# Patient Record
Sex: Male | Born: 1975 | Race: White | Hispanic: No | Marital: Married | Smoking: Never smoker
Health system: Southern US, Community
[De-identification: ages and names within clinical notes are randomized; demographics above are authoritative.]

## PROBLEM LIST (undated history)

## (undated) DIAGNOSIS — G4700 Insomnia, unspecified: Secondary | ICD-10-CM

## (undated) DIAGNOSIS — E66811 Obesity, class 1: Secondary | ICD-10-CM

## (undated) DIAGNOSIS — K589 Irritable bowel syndrome without diarrhea: Secondary | ICD-10-CM

## (undated) DIAGNOSIS — E669 Obesity, unspecified: Secondary | ICD-10-CM

## (undated) DIAGNOSIS — K760 Fatty (change of) liver, not elsewhere classified: Secondary | ICD-10-CM

## (undated) DIAGNOSIS — T7840XA Allergy, unspecified, initial encounter: Secondary | ICD-10-CM

## (undated) DIAGNOSIS — J302 Other seasonal allergic rhinitis: Secondary | ICD-10-CM

## (undated) DIAGNOSIS — F419 Anxiety disorder, unspecified: Secondary | ICD-10-CM

## (undated) HISTORY — DX: Fatty (change of) liver, not elsewhere classified: K76.0

## (undated) HISTORY — DX: Irritable bowel syndrome, unspecified: K58.9

## (undated) HISTORY — DX: Allergy, unspecified, initial encounter: T78.40XA

## (undated) HISTORY — DX: Morbid (severe) obesity due to excess calories: E66.01

## (undated) HISTORY — DX: Other seasonal allergic rhinitis: J30.2

## (undated) HISTORY — DX: Anxiety disorder, unspecified: F41.9

## (undated) HISTORY — DX: Insomnia, unspecified: G47.00

## (undated) HISTORY — DX: Obesity, class 1: E66.811

## (undated) HISTORY — DX: Obesity, unspecified: E66.9

---

## 2009-11-23 ENCOUNTER — Emergency Department (HOSPITAL_BASED_OUTPATIENT_CLINIC_OR_DEPARTMENT_OTHER): Admission: EM | Admit: 2009-11-23 | Discharge: 2009-11-23 | Payer: Self-pay | Admitting: Emergency Medicine

## 2011-01-25 ENCOUNTER — Ambulatory Visit: Payer: BC Managed Care – PPO | Attending: Orthopedic Surgery | Admitting: Physical Therapy

## 2011-01-25 DIAGNOSIS — R5381 Other malaise: Secondary | ICD-10-CM | POA: Insufficient documentation

## 2011-01-25 DIAGNOSIS — IMO0001 Reserved for inherently not codable concepts without codable children: Secondary | ICD-10-CM | POA: Insufficient documentation

## 2011-01-25 DIAGNOSIS — M25519 Pain in unspecified shoulder: Secondary | ICD-10-CM | POA: Insufficient documentation

## 2011-01-25 DIAGNOSIS — M25619 Stiffness of unspecified shoulder, not elsewhere classified: Secondary | ICD-10-CM | POA: Insufficient documentation

## 2011-02-05 ENCOUNTER — Ambulatory Visit: Payer: BC Managed Care – PPO | Attending: Orthopedic Surgery | Admitting: Physical Therapy

## 2011-02-05 DIAGNOSIS — M25519 Pain in unspecified shoulder: Secondary | ICD-10-CM | POA: Insufficient documentation

## 2011-02-05 DIAGNOSIS — M25619 Stiffness of unspecified shoulder, not elsewhere classified: Secondary | ICD-10-CM | POA: Insufficient documentation

## 2011-02-05 DIAGNOSIS — R5381 Other malaise: Secondary | ICD-10-CM | POA: Insufficient documentation

## 2011-02-05 DIAGNOSIS — IMO0001 Reserved for inherently not codable concepts without codable children: Secondary | ICD-10-CM | POA: Insufficient documentation

## 2011-02-08 ENCOUNTER — Encounter: Payer: BC Managed Care – PPO | Admitting: *Deleted

## 2013-04-15 ENCOUNTER — Ambulatory Visit: Payer: Self-pay | Admitting: Family Medicine

## 2014-06-29 ENCOUNTER — Telehealth: Payer: Self-pay | Admitting: Family Medicine

## 2014-06-29 NOTE — Telephone Encounter (Signed)
Patient aware that we have no available appointments in the next few weeks for a new patient.

## 2014-07-15 ENCOUNTER — Ambulatory Visit (INDEPENDENT_AMBULATORY_CARE_PROVIDER_SITE_OTHER): Payer: BC Managed Care – PPO | Admitting: Family Medicine

## 2014-07-15 ENCOUNTER — Encounter: Payer: Self-pay | Admitting: Family Medicine

## 2014-07-15 VITALS — BP 119/81 | HR 77 | Temp 98.2°F | Resp 18 | Ht 72.0 in | Wt 235.0 lb

## 2014-07-15 DIAGNOSIS — G4700 Insomnia, unspecified: Secondary | ICD-10-CM

## 2014-07-15 DIAGNOSIS — F411 Generalized anxiety disorder: Secondary | ICD-10-CM

## 2014-07-15 MED ORDER — ALPRAZOLAM 1 MG PO TABS: ORAL_TABLET | ORAL | Status: DC

## 2014-07-15 NOTE — Progress Notes (Signed)
Pre visit review using our clinic review tool, if applicable. No additional management support is needed unless otherwise documented below in the visit note. 

## 2014-07-15 NOTE — Progress Notes (Signed)
Office Note 07/15/2014  CC:  Chief Complaint  Patient presents with  . Establish Care    [facility].    HPI:  George Lam is a 38 y.o. White male who is here to establish care. Patient's most recent primary MD: [facility]: "left b/c too many doctors lately". Old records were not reviewed prior to or during today's visit.  Primary prob is anxiety: mind won't stop, problems sleeping due to this, work related stress, travel related stress, irritable, keyed up, energy down.  Takes 1/2 alprazolam tab late in afternoon or 1/4 tab prior to flying. These worries/anxieties are a problem in evenings, not so much in daytime.  Recently his prior PCP (one of them at [facility]) tried him on lexapro and he took it for about 2 wks and it helped a bit with anxiety but didn't help sleep so he went back to taking some leftover alprazolam. He needs RF of this med though.   Past Medical History  Diagnosis Date  . Seasonal allergic rhinitis   . Anxiety     alpraz started about 2011    History reviewed. No pertinent past surgical history.  Family History  Problem Relation Age of Onset  . Arthritis Father   . Colon cancer Neg Hx   . Prostate cancer Neg Hx     History   Social History  . Marital Status: Married    Spouse Name: N/A    Number of Children: N/A  . Years of Education: N/A   Occupational History  . Not on file.   Social History Main Topics  . Smoking status: Never Smoker   . Smokeless tobacco: Never Used  . Alcohol Use: No  . Drug Use: No  . Sexual Activity: Not on file   Other Topics Concern  . Not on file   Social History Narrative   Married, 2 sons, lives in [place].   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   College: [employer], where he is originally from.   No T/A/Ds.    Outpatient Encounter Prescriptions as of 07/15/2014  Medication Sig  . ALPRAZolam (XANAX) 1 MG tablet 1-2 tab po qd prn  anxiety/insomnia  . [DISCONTINUED] alprazolam (XANAX) 2 MG tablet   1/2 tab po qhs  No Known Allergies  ROS Review of Systems  Constitutional: Negative for fever and fatigue.  HENT: Negative for congestion and sore throat.   Eyes: Negative for visual disturbance.  Respiratory: Negative for cough.   Cardiovascular: Negative for chest pain.  Gastrointestinal: Negative for nausea and abdominal pain.  Genitourinary: Negative for dysuria.  Musculoskeletal: Negative for back pain and joint swelling.  Skin: Negative for rash.  Neurological: Negative for weakness and headaches.  Hematological: Negative for adenopathy.  Psychiatric/Behavioral: Negative for dysphoric mood.    PE; Blood pressure 119/81, pulse 77, temperature 98.2 F (36.8 C), temperature source Temporal, resp. rate 18, height 6' (1.829 m), weight 235 lb (106.595 kg), SpO2 98 %. Gen: Alert, well appearing.  Patient is oriented to person, place, time, and situation. XIP:JASN: no injection, icteris, swelling, or exudate.  EOMI, PERRLA. Mouth: lips without lesion/swelling.  Oral mucosa pink and moist. Oropharynx without erythema, exudate, or swelling.  CV: RRR, no m/r/g.   LUNGS: CTA bilat, nonlabored resps, good aeration in all lung fields. EXT: no clubbing, cyanosis, or edema.  Neuro: CN 2-12 intact bilaterally.  No tremor.    No ataxia.  No pronator drift.  Pertinent labs:  none  ASSESSMENT AND  PLAN:   New pt  Insomnia, with hx of situational anxiety (associated with car or airplane travel): has had several years of success on 1mg  alprazolam dosing qhs and occasional 0.5mg  alpraz dosing prn travel. Recent lexapro trial was fine for mild generalized anxiety but we discussed this today and don't think this is necessary for him at this time.  He'll call or return if this situation changes. For now, continue alpraz 1mg  qhs and 0.5 mg prn travel, #45 per month, RF x 5. Obtain old PCP records.  F/u 6 mo

## 2014-08-10 ENCOUNTER — Ambulatory Visit (INDEPENDENT_AMBULATORY_CARE_PROVIDER_SITE_OTHER): Payer: BLUE CROSS/BLUE SHIELD | Admitting: Family Medicine

## 2014-08-10 ENCOUNTER — Encounter: Payer: Self-pay | Admitting: Family Medicine

## 2014-08-10 VITALS — BP 154/89 | HR 118 | Temp 101.4°F | Resp 22 | Ht 72.0 in | Wt 236.0 lb

## 2014-08-10 DIAGNOSIS — R509 Fever, unspecified: Secondary | ICD-10-CM

## 2014-08-10 DIAGNOSIS — J028 Acute pharyngitis due to other specified organisms: Secondary | ICD-10-CM

## 2014-08-10 DIAGNOSIS — J029 Acute pharyngitis, unspecified: Secondary | ICD-10-CM

## 2014-08-10 DIAGNOSIS — I889 Nonspecific lymphadenitis, unspecified: Secondary | ICD-10-CM

## 2014-08-10 DIAGNOSIS — E860 Dehydration: Secondary | ICD-10-CM

## 2014-08-10 MED ORDER — OXYCODONE-ACETAMINOPHEN 5-325 MG PO TABS: 1.0000 | ORAL_TABLET | Freq: Four times a day (QID) | ORAL | Status: DC | PRN

## 2014-08-10 MED ORDER — CEFDINIR 300 MG PO CAPS: 300.0000 mg | ORAL_CAPSULE | Freq: Two times a day (BID) | ORAL | Status: DC

## 2014-08-10 NOTE — Progress Notes (Signed)
OFFICE NOTE  08/10/2014  CC:  Chief Complaint  Patient presents with  . Sore Throat    x 4 days  . Ear Pain  . Headache  . Generalized Body Aches   HPI: Patient is a 39 y.o. male who is here for sore throat.   Onset 4 days ago, ST, fever, achy, lots of ear pressure today, HA.  No significant cough.  Some slight nasal congestion.  No n/v/d.  No appetite, poor sleep.  No abd pain. No recent sick contacts.  Motrin 600mg  q6h.  Nyquil, advil cold/sinus etc.  Gradually sx's worsening.  Pertinent PMH:  Past medical, surgical, social, and family history reviewed and no changes are noted since last office visit. No signif med issues.  MEDS:  Outpatient Prescriptions Prior to Visit  Medication Sig Dispense Refill  . ALPRAZolam (XANAX) 1 MG tablet 1-2 tab po qd prn anxiety/insomnia 45 tablet 5   No facility-administered medications prior to visit.    PE: Blood pressure 154/89, pulse 118, temperature 101.4 F (38.6 C), temperature source Temporal, resp. rate 22, height 6' (1.829 m), weight 236 lb (107.049 kg), SpO2 97 %. Gen: alert, sick appearing, slumped posture on exam table.  Oriented x 4, talks softly b/c hurts to talk.  No hot-potato voice. HEENT: eyes without injection, drainage, or swelling.  Ears: EACs clear, TMs with normal light reflex and landmarks.  Nose: Clear rhinorrhea, with some dried, crusty exudate adherent to mildly injected mucosa.  No purulent d/c.  No paranasal sinus TTP.  No facial swelling.  Throat and mouth without focal lesion.  Mild pharyngeal and soft palate erythema but no exudate or significant swelling.   Neck: supple, no distinct LAD but he has significant tenderness along anter cervical LN chain bilat.  NO signif tracheal tenderness.   LUNGS: CTA bilat, nonlabored resps.  No stridor. CV: RRR, no m/r/g. EXT: no c/c/e SKIN: no rash  RAPID strep test today was NEG.  IMPRESSION AND PLAN:  Febrile resp illness/pharyngitis, rapid strep neg: this could be  ILI with complication of cervical LAD. Could be strep pharyngitis with false neg rapid strep. Could be other viral pharyngitis. With length of illness/fever, and significant cervical LN tenderness I will start empiric omnicef 300 mg bid and send group A strep throat culture. Will rx percocet 5/325, 1-2 q6h prn, #30 for symptom management, encouraged oral rehydration.  An After Visit Summary was printed and given to the patient.   FOLLOW UP: call or return in 2 d if fevers not resolved or if ST not improved any.

## 2014-08-10 NOTE — Progress Notes (Signed)
Pre visit review using our clinic review tool, if applicable. No additional management support is needed unless otherwise documented below in the visit note. 

## 2014-08-12 LAB — CULTURE, GROUP A STREP: ORGANISM ID, BACTERIA: NORMAL

## 2015-01-12 ENCOUNTER — Ambulatory Visit (INDEPENDENT_AMBULATORY_CARE_PROVIDER_SITE_OTHER): Payer: BLUE CROSS/BLUE SHIELD | Admitting: Family Medicine

## 2015-01-12 ENCOUNTER — Encounter: Payer: Self-pay | Admitting: Family Medicine

## 2015-01-12 VITALS — BP 116/80 | HR 60 | Temp 97.9°F | Resp 16 | Wt 242.0 lb

## 2015-01-12 DIAGNOSIS — F418 Other specified anxiety disorders: Secondary | ICD-10-CM

## 2015-01-12 DIAGNOSIS — G4700 Insomnia, unspecified: Secondary | ICD-10-CM | POA: Diagnosis not present

## 2015-01-12 DIAGNOSIS — F411 Generalized anxiety disorder: Secondary | ICD-10-CM

## 2015-01-12 MED ORDER — ALPRAZOLAM 1 MG PO TABS: ORAL_TABLET | ORAL | Status: DC

## 2015-01-12 NOTE — Progress Notes (Signed)
Pre visit review using our clinic review tool, if applicable. No additional management support is needed unless otherwise documented below in the visit note. 

## 2015-01-12 NOTE — Progress Notes (Signed)
OFFICE NOTE  01/12/2015  CC:  Chief Complaint  Patient presents with  . Follow-up    6 month f/u. Pt is fasting.    HPI: Patient is a 39 y.o. Caucasian male who is here for 6 mo f/u insomnia and situational anxiety>generalized anxiety. He is fasting today.  Review of old PCP records shows last health panel labs were 03/2014 and all normal. Takes 1mg  xanax around 8 pm and this helps calm him and allows for good sleep.  Pertinent PMH:  Past medical, surgical, social, and family history reviewed and no changes are noted since last office visit.  MEDS: Not taking cefdinir or oxycodone listed below Outpatient Prescriptions Prior to Visit  Medication Sig Dispense Refill  . ALPRAZolam (XANAX) 1 MG tablet 1-2 tab po qd prn anxiety/insomnia 45 tablet 5  . cefdinir (OMNICEF) 300 MG capsule Take 1 capsule (300 mg total) by mouth 2 (two) times daily. (Patient not taking: Reported on 01/12/2015) 20 capsule 0  . oxyCODONE-acetaminophen (PERCOCET/ROXICET) 5-325 MG per tablet Take 1-2 tablets by mouth every 6 (six) hours as needed for severe pain. (Patient not taking: Reported on 01/12/2015) 30 tablet 0   No facility-administered medications prior to visit.    PE: Blood pressure 116/80, pulse 60, temperature 97.9 F (36.6 C), temperature source Oral, resp. rate 16, weight 242 lb (109.77 kg), SpO2 98 %. Gen: Alert, well appearing.  Patient is oriented to person, place, time, and situation. AFFECT: pleasant, lucid thought and speech. No further exam today  LABS: none today  IMPRESSION AND PLAN:  Mild GAD/situational anxiety and insomnia: doing well on current alpraz 1mg  qhs. Rx renewed today.  FOLLOW UP: 4 mo for CPE with fasting labs prior

## 2015-05-11 ENCOUNTER — Other Ambulatory Visit (INDEPENDENT_AMBULATORY_CARE_PROVIDER_SITE_OTHER): Payer: BLUE CROSS/BLUE SHIELD

## 2015-05-11 DIAGNOSIS — Z0000 Encounter for general adult medical examination without abnormal findings: Secondary | ICD-10-CM

## 2015-05-11 LAB — CBC WITH DIFFERENTIAL/PLATELET
BASOS ABS: 0 10*3/uL (ref 0.0–0.1)
Basophils Relative: 0.5 % (ref 0.0–3.0)
EOS ABS: 0.2 10*3/uL (ref 0.0–0.7)
Eosinophils Relative: 2 % (ref 0.0–5.0)
HEMATOCRIT: 48.2 % (ref 39.0–52.0)
HEMOGLOBIN: 16.3 g/dL (ref 13.0–17.0)
LYMPHS PCT: 26 % (ref 12.0–46.0)
Lymphs Abs: 2 10*3/uL (ref 0.7–4.0)
MCHC: 33.7 g/dL (ref 30.0–36.0)
MCV: 90.7 fl (ref 78.0–100.0)
Monocytes Absolute: 0.5 10*3/uL (ref 0.1–1.0)
Monocytes Relative: 7.2 % (ref 3.0–12.0)
Neutro Abs: 4.9 10*3/uL (ref 1.4–7.7)
Neutrophils Relative %: 64.3 % (ref 43.0–77.0)
Platelets: 267 10*3/uL (ref 150.0–400.0)
RBC: 5.31 Mil/uL (ref 4.22–5.81)
RDW: 12.9 % (ref 11.5–15.5)
WBC: 7.7 10*3/uL (ref 4.0–10.5)

## 2015-05-11 LAB — COMPREHENSIVE METABOLIC PANEL
ALBUMIN: 4.6 g/dL (ref 3.5–5.2)
ALT: 61 U/L — AB (ref 0–53)
AST: 30 U/L (ref 0–37)
Alkaline Phosphatase: 72 U/L (ref 39–117)
BILIRUBIN TOTAL: 0.6 mg/dL (ref 0.2–1.2)
BUN: 19 mg/dL (ref 6–23)
CALCIUM: 9.8 mg/dL (ref 8.4–10.5)
CO2: 31 mEq/L (ref 19–32)
CREATININE: 1.05 mg/dL (ref 0.40–1.50)
Chloride: 104 mEq/L (ref 96–112)
GFR: 83.98 mL/min (ref >60.00)
Glucose, Bld: 82 mg/dL (ref 70–99)
Potassium: 5.2 mEq/L — ABNORMAL HIGH (ref 3.5–5.1)
Sodium: 140 mEq/L (ref 135–145)
Total Protein: 7 g/dL (ref 6.0–8.3)

## 2015-05-11 LAB — LIPID PANEL
CHOL/HDL RATIO: 5
Cholesterol: 197 mg/dL (ref 0–200)
HDL: 37.4 mg/dL — ABNORMAL LOW (ref >39.00)
LDL CALC: 132 mg/dL — AB (ref 0–99)
NonHDL: 159.2
TRIGLYCERIDES: 134 mg/dL (ref 0.0–149.0)
VLDL: 26.8 mg/dL (ref 0.0–40.0)

## 2015-05-11 LAB — TSH: TSH: 3.19 u[IU]/mL (ref 0.35–4.50)

## 2015-05-18 ENCOUNTER — Encounter: Payer: BLUE CROSS/BLUE SHIELD | Admitting: Family Medicine

## 2015-05-26 ENCOUNTER — Encounter: Payer: Self-pay | Admitting: Family Medicine

## 2015-05-26 ENCOUNTER — Ambulatory Visit (INDEPENDENT_AMBULATORY_CARE_PROVIDER_SITE_OTHER): Payer: BLUE CROSS/BLUE SHIELD | Admitting: Family Medicine

## 2015-05-26 VITALS — BP 117/81 | HR 62 | Temp 97.8°F | Resp 16 | Ht 72.0 in | Wt 234.0 lb

## 2015-05-26 DIAGNOSIS — R740 Nonspecific elevation of levels of transaminase and lactic acid dehydrogenase [LDH]: Secondary | ICD-10-CM | POA: Diagnosis not present

## 2015-05-26 DIAGNOSIS — R7401 Elevation of levels of liver transaminase levels: Secondary | ICD-10-CM

## 2015-05-26 DIAGNOSIS — E875 Hyperkalemia: Secondary | ICD-10-CM

## 2015-05-26 DIAGNOSIS — Z0000 Encounter for general adult medical examination without abnormal findings: Secondary | ICD-10-CM

## 2015-05-26 LAB — COMPREHENSIVE METABOLIC PANEL
ALT: 46 U/L (ref 0–53)
AST: 23 U/L (ref 0–37)
Albumin: 4.6 g/dL (ref 3.5–5.2)
Alkaline Phosphatase: 65 U/L (ref 39–117)
BILIRUBIN TOTAL: 0.8 mg/dL (ref 0.2–1.2)
BUN: 17 mg/dL (ref 6–23)
CALCIUM: 9.9 mg/dL (ref 8.4–10.5)
CHLORIDE: 104 meq/L (ref 96–112)
CO2: 28 meq/L (ref 19–32)
Creatinine, Ser: 0.98 mg/dL (ref 0.40–1.50)
GFR: 90.92 mL/min (ref >60.00)
Glucose, Bld: 84 mg/dL (ref 70–99)
Potassium: 4.3 mEq/L (ref 3.5–5.1)
Sodium: 139 mEq/L (ref 135–145)
Total Protein: 7.2 g/dL (ref 6.0–8.3)

## 2015-05-26 NOTE — Progress Notes (Signed)
Pre visit review using our clinic review tool, if applicable. No additional management support is needed unless otherwise documented below in the visit note. 

## 2015-05-26 NOTE — Progress Notes (Signed)
Office Note 05/26/2015  CC:  Chief Complaint  Patient presents with  . Annual Exam    Pt is fasting.     HPI:  George Lam is a 39 y.o. White male who is here for annual health maintenance exam. Reviewed recent fasting labs in detail today. K 5.2 and ALT 61--both just a smidge elevated, otherwise all labs wnl. He is fasting today.  We discussed just repeating a CMET today and he was fine with this.   Past Medical History  Diagnosis Date  . Seasonal allergic rhinitis   . Anxiety     alpraz started about 2011  . Insomnia     No past surgical history on file.  Family History  Problem Relation Age of Onset  . Arthritis Father   . Colon cancer Neg Hx   . Prostate cancer Neg Hx     Social History   Social History  . Marital Status: Married    Spouse Name: N/A  . Number of Children: N/A  . Years of Education: N/A   Occupational History  . Not on file.   Social History Main Topics  . Smoking status: Never Smoker   . Smokeless tobacco: Never Used  . Alcohol Use: No  . Drug Use: No  . Sexual Activity: Not on file   Other Topics Concern  . Not on file   Social History Narrative   Married, 2 sons, lives in [place].   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   College: [employer], where he is originally from.   No T/A/Ds.    Outpatient Prescriptions Prior to Visit  Medication Sig Dispense Refill  . ALPRAZolam (XANAX) 1 MG tablet 1-2 tab po qd prn anxiety/insomnia 45 tablet 5   No facility-administered medications prior to visit.    No Known Allergies  ROS Review of Systems  Constitutional: Negative for fever, chills, appetite change and fatigue.  HENT: Negative for congestion, dental problem, ear pain and sore throat.   Eyes: Negative for discharge, redness and visual disturbance.  Respiratory: Negative for cough, chest tightness, shortness of breath and wheezing.   Cardiovascular: Negative for chest pain, palpitations and  leg swelling.  Gastrointestinal: Negative for nausea, vomiting, abdominal pain, diarrhea and blood in stool.  Genitourinary: Negative for dysuria, urgency, frequency, hematuria, flank pain and difficulty urinating.  Musculoskeletal: Negative for myalgias, back pain, joint swelling, arthralgias and neck stiffness.  Skin: Negative for pallor and rash.  Neurological: Negative for dizziness, speech difficulty, weakness and headaches.  Hematological: Negative for adenopathy. Does not bruise/bleed easily.  Psychiatric/Behavioral: Negative for confusion and sleep disturbance. The patient is not nervous/anxious.     PE; Blood pressure 117/81, pulse 62, temperature 97.8 F (36.6 C), temperature source Oral, resp. rate 16, height 6' (1.829 m), weight 234 lb (106.142 kg), SpO2 99 %. Gen: Alert, well appearing.  Patient is oriented to person, place, time, and situation. AFFECT: pleasant, lucid thought and speech. ENT: Ears: EACs clear, normal epithelium.  TMs with good light reflex and landmarks bilaterally.  Eyes: no injection, icteris, swelling, or exudate.  EOMI, PERRLA. Nose: no drainage or turbinate edema/swelling.  No injection or focal lesion.  Mouth: lips without lesion/swelling.  Oral mucosa pink and moist.  Dentition intact and without obvious caries or gingival swelling.  Oropharynx without erythema, exudate, or swelling.  Neck: supple/nontender.  No LAD, mass, or TM.  Carotid pulses 2+ bilaterally, without bruits. CV: RRR, no m/r/g.   LUNGS: CTA bilat, nonlabored resps, good aeration  in all lung fields. ABD: soft, NT, ND, BS normal.  No hepatospenomegaly or mass.  No bruits. EXT: no clubbing, cyanosis, or edema.  Musculoskeletal: no joint swelling, erythema, warmth, or tenderness.  ROM of all joints intact. Skin - no sores or suspicious lesions or rashes or color changes  Pertinent labs:  Lab Results  Component Value Date   TSH 3.19 05/11/2015   Lab Results  Component Value Date   WBC  7.7 05/11/2015   HGB 16.3 05/11/2015   HCT 48.2 05/11/2015   MCV 90.7 05/11/2015   PLT 267.0 05/11/2015   Lab Results  Component Value Date   CREATININE 1.05 05/11/2015   BUN 19 05/11/2015   NA 140 05/11/2015   K 5.2* 05/11/2015   CL 104 05/11/2015   CO2 31 05/11/2015   Lab Results  Component Value Date   ALT 61* 05/11/2015   AST 30 05/11/2015   ALKPHOS 72 05/11/2015   BILITOT 0.6 05/11/2015   Lab Results  Component Value Date   CHOL 197 05/11/2015   Lab Results  Component Value Date   HDL 37.40* 05/11/2015   Lab Results  Component Value Date   LDLCALC 132* 05/11/2015   Lab Results  Component Value Date   TRIG 134.0 05/11/2015   Lab Results  Component Value Date   CHOLHDL 5 05/11/2015   ASSESSMENT AND PLAN:   Health maintenance exam: Reviewed age and gender appropriate health maintenance issues (prudent diet, regular exercise, health risks of tobacco and excessive alcohol, use of seatbelts, fire alarms in home, use of sunscreen).  Also reviewed age and gender appropriate health screening as well as vaccine recommendations. He declined flu vaccine today. Due to very mild elevation of K and ALT we repeated these today.  An After Visit Summary was printed and given to the patient.  FOLLOW UP:  Return in about 1 year (around 05/25/2016) for annual CPE (fasting).

## 2015-08-23 ENCOUNTER — Other Ambulatory Visit: Payer: Self-pay | Admitting: *Deleted

## 2015-08-23 MED ORDER — ALPRAZOLAM 1 MG PO TABS: ORAL_TABLET | ORAL | Status: DC

## 2015-08-23 NOTE — Telephone Encounter (Signed)
Pt LMOM on 08/23/15 at 9:20am requesting refills  RF request for alprazolam LOV: 05/26/15 Next ov: None Last written: 01/12/15 #45 w/ 5RF  Please advise. Thanks.

## 2015-08-24 NOTE — Telephone Encounter (Signed)
Rx faxed

## 2015-11-04 ENCOUNTER — Encounter: Payer: Self-pay | Admitting: Family Medicine

## 2015-11-04 ENCOUNTER — Ambulatory Visit (INDEPENDENT_AMBULATORY_CARE_PROVIDER_SITE_OTHER): Payer: BLUE CROSS/BLUE SHIELD | Admitting: Family Medicine

## 2015-11-04 VITALS — BP 117/80 | HR 64 | Temp 97.9°F | Resp 16 | Ht 72.0 in | Wt 245.5 lb

## 2015-11-04 DIAGNOSIS — B309 Viral conjunctivitis, unspecified: Secondary | ICD-10-CM

## 2015-11-04 NOTE — Progress Notes (Signed)
Pre visit review using our clinic review tool, if applicable. No additional management support is needed unless otherwise documented below in the visit note. 

## 2015-11-04 NOTE — Progress Notes (Signed)
OFFICE VISIT  11/04/2015   CC:  Chief Complaint  Patient presents with  . Eye Drainage    with burning, itching and redness x 3 days - Left eye   HPI:    Patient is a 40 y.o.  male who presents for concern of pink eye.  Onset 3 d/a itchy, gritty feeling L eye, bloodshot. This morning it was pasted shut from the d/c.  Draining on/off last few days, yellowish material.  Says eye is uncomfortable now.  Occ blurred vision but nothing persistent. Slight ST last few days.  No signif nasal sx's, no PND.  No fever. No R eye symptoms.   Past Medical History  Diagnosis Date  . Seasonal allergic rhinitis   . Anxiety     alpraz started about 2011  . Insomnia     No past surgical history on file.  Outpatient Prescriptions Prior to Visit  Medication Sig Dispense Refill  . ALPRAZolam (XANAX) 1 MG tablet 1-2 tab po qd prn anxiety/insomnia 45 tablet 5   No facility-administered medications prior to visit.    No Known Allergies  ROS As per HPI  PE: Blood pressure 117/80, pulse 64, temperature 97.9 F (36.6 C), temperature source Oral, resp. rate 16, height 6' (1.829 m), weight 245 lb 8 oz (111.358 kg), SpO2 96 %. Gen: Alert, well appearing.  Patient is oriented to person, place, time, and situation. AFFECT: pleasant, lucid thought and speech. ENT: Ears: EACs clear, normal epithelium.  TMs with good light reflex and landmarks bilaterally.  Eyes:  R eye--no injection, icteris, swelling, or exudate.  L eye with diffuse bulbar and palpebral conjunctival injection.  No drainage present, no swelling present.  Eye lashes appear normal.  EOMI, PERRLA. Nose: no drainage or turbinate edema/swelling.  No injection or focal lesion.  Mouth: lips without lesion/swelling.  Oral mucosa pink and moist.  Dentition intact and without obvious caries or gingival swelling.  Oropharynx without erythema, exudate, or swelling.    LABS:  none  IMPRESSION AND PLAN:  Left eye conjunctivitis, strongly suspect  viral etiology. Discussed dx with pt. Instructions: Buy over the counter, generic Zaditor eye drops (use as directed on the packaging). Use a warm compress on your eye 1-2 times a day.  An After Visit Summary was printed and given to the patient.  FOLLOW UP: No Follow-up on file.  Signed:  Crissie Sickles, MD           11/04/2015

## 2015-11-04 NOTE — Patient Instructions (Signed)
Buy over the counter, generic Zaditor eye drops (use as directed on the packaging). Use a warm compress on your eye 1-2 times a day.

## 2016-02-15 ENCOUNTER — Other Ambulatory Visit (INDEPENDENT_AMBULATORY_CARE_PROVIDER_SITE_OTHER): Payer: BLUE CROSS/BLUE SHIELD

## 2016-02-15 ENCOUNTER — Ambulatory Visit (INDEPENDENT_AMBULATORY_CARE_PROVIDER_SITE_OTHER): Payer: BLUE CROSS/BLUE SHIELD | Admitting: Gastroenterology

## 2016-02-15 ENCOUNTER — Encounter: Payer: Self-pay | Admitting: Gastroenterology

## 2016-02-15 VITALS — BP 110/80 | HR 75 | Ht 73.0 in | Wt 245.0 lb

## 2016-02-15 DIAGNOSIS — R740 Nonspecific elevation of levels of transaminase and lactic acid dehydrogenase [LDH]: Secondary | ICD-10-CM

## 2016-02-15 DIAGNOSIS — R7401 Elevation of levels of liver transaminase levels: Secondary | ICD-10-CM

## 2016-02-15 DIAGNOSIS — K529 Noninfective gastroenteritis and colitis, unspecified: Secondary | ICD-10-CM

## 2016-02-15 LAB — IGA: IgA: 226 mg/dL (ref 68–378)

## 2016-02-15 LAB — HEPATIC FUNCTION PANEL
ALT: 57 U/L — AB (ref 0–53)
AST: 25 U/L (ref 0–37)
Albumin: 4.9 g/dL (ref 3.5–5.2)
Alkaline Phosphatase: 69 U/L (ref 39–117)
BILIRUBIN DIRECT: 0.1 mg/dL (ref 0.0–0.3)
BILIRUBIN TOTAL: 1 mg/dL (ref 0.2–1.2)
Total Protein: 7.7 g/dL (ref 6.0–8.3)

## 2016-02-15 NOTE — Progress Notes (Signed)
HPI :  40 y/o male here for a new patient visit for problems with his bowels. He has a history of insomnia / anxiety, treated with Xanax PRN. He endorses longstanding symptoms for several years. He reports certain foods can cause the urge to have a bowel movement, which is usually loose stools. He reports anxiety / stress can also cause loose stools.  He reports if he does not "eat well" he will have urgency of bowels. He reports having 2-3 BMs per day, upwards of 7-8 BMs per day when not eating well. Stools usually loose. No blood in the stools. No abdominal pains. He can have cramping abdominal pains associated with the urge to have a bowel movement which is relieved with a bowel movement. He normally does not have any nocturnal stools, he has had this only rarely after eating a late dinner. He denies any significant bloating. He thinks he has gained 20-25 lbs in the past 1-2 years or so. He is eating well without vomiting or nausea. No dysphagia. He denies NSAID use.  No FH of colon cancer. No known FH of Crohns or colitis. No FH known of celiac disease.   No tobacco use, no alcohol.     Past Medical History  Diagnosis Date  . Seasonal allergic rhinitis   . Anxiety     alpraz started about 2011  . Insomnia      No past surgical history on file. Family History  Problem Relation Age of Onset  . Arthritis Father   . Colon cancer Neg Hx   . Prostate cancer Neg Hx    Social History  Substance Use Topics  . Smoking status: Never Smoker   . Smokeless tobacco: Never Used  . Alcohol Use: No   Current Outpatient Prescriptions  Medication Sig Dispense Refill  . ALPRAZolam (XANAX) 1 MG tablet 1-2 tab po qd prn anxiety/insomnia 45 tablet 5   No current facility-administered medications for this visit.   No Known Allergies   Review of Systems: All systems reviewed and negative except where noted in HPI.   Lab Results  Component Value Date   WBC 7.7 05/11/2015   HGB 16.3  05/11/2015   HCT 48.2 05/11/2015   MCV 90.7 05/11/2015   PLT 267.0 05/11/2015   ALT of 60s back in October 2016, normal AST and AP   Physical Exam: BP 110/80 mmHg  Pulse 75  Ht 6\' 1"  (1.854 m)  Wt 245 lb (111.131 kg)  BMI 32.33 kg/m2 Constitutional: Pleasant,well-developed, male in no acute distress. HEENT: Normocephalic and atraumatic. Conjunctivae are normal. No scleral icterus. Neck supple.  Cardiovascular: Normal rate, regular rhythm.  Pulmonary/chest: Effort normal and breath sounds normal. No wheezing, rales or rhonchi. Abdominal: Soft, nondistended, nontender. Bowel sounds active throughout. There are no masses palpable. No hepatomegaly. Extremities: no edema Lymphadenopathy: No cervical adenopathy noted. Neurological: Alert and oriented to person place and time. Skin: Skin is warm and dry. No rashes noted. Psychiatric: Normal mood and affect. Behavior is normal.   ASSESSMENT AND PLAN: 40 y/o male with longstanding loose stools, usually worse in stressful / anxious settings, or when eating foods he normally does not eat. He meets criteria for IBS, no alarm symptoms or anemia, and I suspect this is the most likely etiology. That being said will send serologies to rule out celiac disease and obtain a fecal calprotectin to ensure normal. I discussed ddx and treatment options. Given the dietary influence of his symptoms I counseled him on  a low FODMOP diet and provided a handout and we will see if this helps. He can also use immodium PRN if eating outside his home. We will await his course and blood work. We may consider a course of Rifaximin or TCA. If fecal calprotectin is elevated or symptoms persist we may consider colonoscopy.  Of note, he had a prior elevation of ALT to 60s in October. Will repeat and see if this is persistently elevated. If so, will need US imaging to assess for steatosis and basic lab workup. He agreed.   Larsen Bay Cellar, MD [facility] Pager [phone]  CC: McGowen, Adrian Blackwater, MD

## 2016-02-15 NOTE — Patient Instructions (Addendum)
Your physician has requested that you go to the basement for lab work before leaving today.  You have been given a Fodmap diet to follow.  Take over the counter Imodium as needed for diarrhea.    I appreciate the opportunity to care for you.

## 2016-02-16 ENCOUNTER — Other Ambulatory Visit: Payer: Self-pay | Admitting: *Deleted

## 2016-02-16 DIAGNOSIS — R740 Nonspecific elevation of levels of transaminase and lactic acid dehydrogenase [LDH]: Principal | ICD-10-CM

## 2016-02-16 DIAGNOSIS — R7401 Elevation of levels of liver transaminase levels: Secondary | ICD-10-CM

## 2016-02-16 LAB — TISSUE TRANSGLUTAMINASE, IGA: Tissue Transglutaminase Ab, IgA: 1 U/mL (ref <4)

## 2016-02-17 ENCOUNTER — Other Ambulatory Visit: Payer: BLUE CROSS/BLUE SHIELD

## 2016-02-17 DIAGNOSIS — R740 Nonspecific elevation of levels of transaminase and lactic acid dehydrogenase [LDH]: Secondary | ICD-10-CM

## 2016-02-17 DIAGNOSIS — K529 Noninfective gastroenteritis and colitis, unspecified: Secondary | ICD-10-CM

## 2016-02-17 DIAGNOSIS — R7401 Elevation of levels of liver transaminase levels: Secondary | ICD-10-CM

## 2016-02-20 ENCOUNTER — Other Ambulatory Visit (INDEPENDENT_AMBULATORY_CARE_PROVIDER_SITE_OTHER): Payer: BLUE CROSS/BLUE SHIELD

## 2016-02-20 ENCOUNTER — Ambulatory Visit (HOSPITAL_COMMUNITY)
Admission: RE | Admit: 2016-02-20 | Discharge: 2016-02-20 | Disposition: A | Discharge status: Home / Self Care | Payer: BLUE CROSS/BLUE SHIELD | Source: Ambulatory Visit | Attending: Gastroenterology | Admitting: Gastroenterology

## 2016-02-20 DIAGNOSIS — K824 Cholesterolosis of gallbladder: Secondary | ICD-10-CM | POA: Diagnosis not present

## 2016-02-20 DIAGNOSIS — K760 Fatty (change of) liver, not elsewhere classified: Secondary | ICD-10-CM | POA: Insufficient documentation

## 2016-02-20 DIAGNOSIS — R740 Nonspecific elevation of levels of transaminase and lactic acid dehydrogenase [LDH]: Secondary | ICD-10-CM | POA: Insufficient documentation

## 2016-02-20 DIAGNOSIS — R7401 Elevation of levels of liver transaminase levels: Secondary | ICD-10-CM

## 2016-02-20 LAB — HEPATITIS B SURFACE ANTIGEN: Hepatitis B Surface Ag: NEGATIVE

## 2016-02-20 LAB — IBC PANEL
IRON: 61 ug/dL (ref 42–165)
Saturation Ratios: 19 % — ABNORMAL LOW (ref 20.0–50.0)
TRANSFERRIN: 229 mg/dL (ref 212.0–360.0)

## 2016-02-20 LAB — FERRITIN: Ferritin: 245.4 ng/mL (ref 22.0–322.0)

## 2016-02-20 LAB — HEPATITIS C ANTIBODY: HCV AB: NEGATIVE

## 2016-02-21 LAB — IGG: IgG (Immunoglobin G), Serum: 1022 mg/dL (ref 694–1618)

## 2016-02-21 LAB — ANA: ANA: NEGATIVE

## 2016-02-23 ENCOUNTER — Other Ambulatory Visit: Payer: Self-pay | Admitting: *Deleted

## 2016-02-23 DIAGNOSIS — K760 Fatty (change of) liver, not elsewhere classified: Secondary | ICD-10-CM

## 2016-02-23 LAB — CALPROTECTIN, FECAL

## 2016-02-23 LAB — ANTI-SMOOTH MUSCLE ANTIBODY, IGG

## 2016-02-23 LAB — CERULOPLASMIN: CERULOPLASMIN: 23 mg/dL (ref 18–36)

## 2016-02-23 LAB — ALPHA-1-ANTITRYPSIN: A-1 Antitrypsin, Ser: 117 mg/dL (ref 83–199)

## 2016-02-27 ENCOUNTER — Other Ambulatory Visit: Payer: Self-pay | Admitting: Family Medicine

## 2016-02-27 NOTE — Telephone Encounter (Signed)
Rx faxed

## 2016-02-27 NOTE — Telephone Encounter (Signed)
RF request for alprazolam LOV: 05/26/15 Next ov:  None Last written: 08/23/15 #45 w/ 5RF  Please advise. Thanks.

## 2016-04-11 ENCOUNTER — Encounter: Payer: Self-pay | Admitting: Gastroenterology

## 2016-04-11 ENCOUNTER — Ambulatory Visit (INDEPENDENT_AMBULATORY_CARE_PROVIDER_SITE_OTHER): Payer: BLUE CROSS/BLUE SHIELD | Admitting: Gastroenterology

## 2016-04-11 VITALS — BP 118/86 | HR 80 | Ht 73.0 in | Wt 249.0 lb

## 2016-04-11 DIAGNOSIS — K529 Noninfective gastroenteritis and colitis, unspecified: Secondary | ICD-10-CM

## 2016-04-11 MED ORDER — RIFAXIMIN 550 MG PO TABS: 550.0000 mg | ORAL_TABLET | Freq: Three times a day (TID) | ORAL | 0 refills | Status: DC

## 2016-04-11 MED ORDER — NA SULFATE-K SULFATE-MG SULF 17.5-3.13-1.6 GM/177ML PO SOLN: 1.0000 | Freq: Once | ORAL | 0 refills | Status: AC

## 2016-04-11 NOTE — Patient Instructions (Signed)
We sent a prescription for Xifaxan 550 mg to [facility].  We also sent a prescription for Suprep, colonoscopy prep.   You have been scheduled for a colonoscopy. Please follow written instructions given to you at your visit today.  Please pick up your prep supplies at the pharmacy within the next 1-3 days. If you use inhalers (even only as needed), please bring them with you on the day of your procedure. Your physician has requested that you go to www.[email or web address].[email or web address] and enter the access code given to you at your visit today. This web site gives a general overview about your procedure. However, you should still follow specific instructions given to you by our office regarding your preparation for the procedure.

## 2016-04-11 NOTE — Progress Notes (Signed)
     04/11/2016 Eugean Hartl [id number] 1976-03-15   History of Present Illness:  This is a 40 year old male who is known to Dr. Havery Moros when he was seen in July for complaints of chronic diarrhea/loose stools.  This has been ongoing for several years but seems to be worsening as he gets older. Seems to be worse with high stress situations. Says that certain foods such as fried foods trigger his symptoms. Has a lot of urgency with this as well. Denies seeing blood in his stool. He can have cramping abdominal pains associated with the urge to have a bowel movement which is most of the time relieved with his bowel movement. He has actually gained weight rather than lose any weight despite avoidance of eating throughout the day with fear of needing to get to a bathroom urgently. Celiac labs tests were negative and fecal calprotectin was normal as well. He did not really try the FODMAP diet because he said that it didn't really apply to the foods that he normally eats anyways. Imodium does help to some degree. He is very distressed by this and says it is controlling his life.   Current Medications, Allergies, Past Medical History, Past Surgical History, Family History and Social History were reviewed in [employer] record.   Physical Exam: BP 118/86   Pulse 80   Ht 6\' 1"  (1.854 m)   Wt 249 lb (112.9 kg)   BMI 32.85 kg/m  General: Well developed white male in no acute distress Head: Normocephalic and atraumatic Eyes:  sclerae anicteric, conjunctiva pink  Ears: Normal auditory acuity Lungs: Clear throughout to auscultation Heart: Regular rate and rhythm Abdomen: Soft, non-distended.  Normal bowel sounds.  Non-tender. Rectal:  Will be done at the time of colonoscopy. Musculoskeletal: Symmetrical with no gross deformities  Extremities: No edema  Neurological: Alert oriented x 4, grossly non-focal Psychological:  Alert and cooperative. Normal mood and  affect  Assessment and Recommendations: -40 year old male with long-standing history of loose stools/diarrhea with urgency:  Seems to be worse with high stress situations, etc. and thought likely to be irritable bowel syndrome. Evaluation so far has been unremarkable.  No improvement in symptoms. We'll proceed with colonoscopy with random biopsies to rule out microscopic colitis. If this is negative then I would definitely think that he has irritable bowel syndrome. In the interim I'm going to treat him with a two-week course of Xifaxan 550 mg 3 times daily. We will assess his response to this medication. If this fails then may want to consider Viberzi or even TCA as suggested previously by Dr. Havery Moros.

## 2016-04-11 NOTE — Progress Notes (Signed)
Agree with assessment and plan as outlined.  

## 2016-04-13 ENCOUNTER — Telehealth: Payer: Self-pay | Admitting: Emergency Medicine

## 2016-04-13 NOTE — Telephone Encounter (Signed)
Fax received from pharmacy requesting PA for Xifaxan. [facility] in [place].

## 2016-04-16 ENCOUNTER — Telehealth: Payer: Self-pay | Admitting: Gastroenterology

## 2016-04-16 MED ORDER — RIFAXIMIN 550 MG PO TABS: 550.0000 mg | ORAL_TABLET | Freq: Three times a day (TID) | ORAL | 0 refills | Status: AC

## 2016-04-16 NOTE — Telephone Encounter (Signed)
[facility] needs Clinical Info the for the [place]

## 2016-04-16 NOTE — Telephone Encounter (Signed)
Sent request to [facility] to do a prior authorization for the Xifaxan 550 mg.  Faxed the office note done by Alonza Bogus PA-C  And the insurance cards today 04-16-2016.

## 2016-04-16 NOTE — Telephone Encounter (Signed)
[facility] to [facility]. Waiting on response.  LM informing pt that it may take up to a week to here from pharmacy.

## 2016-04-18 ENCOUNTER — Encounter: Payer: BLUE CROSS/BLUE SHIELD | Admitting: Gastroenterology

## 2016-04-18 NOTE — Telephone Encounter (Signed)
Notes sent to [facility].

## 2016-04-24 ENCOUNTER — Encounter: Payer: Self-pay | Admitting: Gastroenterology

## 2016-04-24 ENCOUNTER — Ambulatory Visit (AMBULATORY_SURGERY_CENTER): Payer: BLUE CROSS/BLUE SHIELD | Admitting: Gastroenterology

## 2016-04-24 VITALS — BP 114/62 | HR 67 | Temp 98.7°F | Resp 9 | Ht 73.0 in | Wt 249.0 lb

## 2016-04-24 DIAGNOSIS — R197 Diarrhea, unspecified: Secondary | ICD-10-CM

## 2016-04-24 DIAGNOSIS — D124 Benign neoplasm of descending colon: Secondary | ICD-10-CM

## 2016-04-24 DIAGNOSIS — D127 Benign neoplasm of rectosigmoid junction: Secondary | ICD-10-CM

## 2016-04-24 DIAGNOSIS — K529 Noninfective gastroenteritis and colitis, unspecified: Secondary | ICD-10-CM

## 2016-04-24 DIAGNOSIS — K635 Polyp of colon: Secondary | ICD-10-CM | POA: Diagnosis not present

## 2016-04-24 HISTORY — PX: COLONOSCOPY W/ POLYPECTOMY: SHX1380

## 2016-04-24 MED ORDER — SODIUM CHLORIDE 0.9 % IV SOLN: 500.0000 mL | INTRAVENOUS | Status: DC

## 2016-04-24 NOTE — Patient Instructions (Signed)
YOU HAD AN ENDOSCOPIC PROCEDURE TODAY AT [facility]:   Refer to the procedure report that was given to you for any specific questions about what was found during the examination.  If the procedure report does not answer your questions, please call your gastroenterologist to clarify.  If you requested that your care partner not be given the details of your procedure findings, then the procedure report has been included in a sealed envelope for you to review at your convenience later.  YOU SHOULD EXPECT: Some feelings of bloating in the abdomen. Passage of more gas than usual.  Walking can help get rid of the air that was put into your GI tract during the procedure and reduce the bloating. If you had a lower endoscopy (such as a colonoscopy or flexible sigmoidoscopy) you may notice spotting of blood in your stool or on the toilet paper. If you underwent a bowel prep for your procedure, you may not have a normal bowel movement for a few days.  Please Note:  You might notice some irritation and congestion in your nose or some drainage.  This is from the oxygen used during your procedure.  There is no need for concern and it should clear up in a day or so.  SYMPTOMS TO REPORT IMMEDIATELY:   Following lower endoscopy (colonoscopy or flexible sigmoidoscopy):  Excessive amounts of blood in the stool  Significant tenderness or worsening of abdominal pains  Swelling of the abdomen that is new, acute  Fever of 100F or higher   Following upper endoscopy (EGD)  Vomiting of blood or coffee ground material  New chest pain or pain under the shoulder blades  Painful or persistently difficult swallowing  New shortness of breath  Fever of 100F or higher  Black, tarry-looking stools  For urgent or emergent issues, a gastroenterologist can be reached at any hour by calling [phone].   DIET:  We do recommend a small meal at first, but then you may proceed to your regular diet.  Drink  plenty of fluids but you should avoid alcoholic beverages for 24 hours.  ACTIVITY:  You should plan to take it easy for the rest of today and you should NOT DRIVE or use heavy machinery until tomorrow (because of the sedation medicines used during the test).    FOLLOW UP: Our staff will call the number listed on your records the next business day following your procedure to check on you and address any questions or concerns that you may have regarding the information given to you following your procedure. If we do not reach you, we will leave a message.  However, if you are feeling well and you are not experiencing any problems, there is no need to return our call.  We will assume that you have returned to your regular daily activities without incident.  If any biopsies were taken you will be contacted by phone or by letter within the next 1-3 weeks.  Please call us at [phone] if you have not heard about the biopsies in 3 weeks.    SIGNATURES/CONFIDENTIALITY: You and/or your care partner have signed paperwork which will be entered into your electronic medical record.  These signatures attest to the fact that that the information above on your After Visit Summary has been reviewed and is understood.  Full responsibility of the confidentiality of this discharge information lies with you and/or your care-partner.  Polyp, hemorrhoid information given.  No aspirin, naproxen, ibuprofen or other  non steroidal anti inflammatory meds  For next 2 weeks.

## 2016-04-24 NOTE — Progress Notes (Signed)
Called to room for pathology. 

## 2016-04-24 NOTE — Progress Notes (Signed)
Report given to PACU RN, vss 

## 2016-04-24 NOTE — Op Note (Signed)
[facility] Patient Name: George Lam Procedure Date: 04/24/2016 8:32 AM MRN: [id number] Endoscopist: Remo Lipps P. Havery Moros , MD Age: 40 Referring MD:  Date of Birth: November 22, 1975 Gender: Male Account #: [id number] Procedure:                Colonoscopy Indications:              Persistent diarrhea of unexplained origin Medicines:                Monitored Anesthesia Care Procedure:                Pre-Anesthesia Assessment:                           - Prior to the procedure, a History and Physical                            was performed, and patient medications and                            allergies were reviewed. The patient's tolerance of                            previous anesthesia was also reviewed. The risks                            and benefits of the procedure and the sedation                            options and risks were discussed with the patient.                            All questions were answered, and informed consent                            was obtained. Prior Anticoagulants: The patient has                            taken no previous anticoagulant or antiplatelet                            agents. ASA Grade Assessment: I - A normal, healthy                            patient. After reviewing the risks and benefits,                            the patient was deemed in satisfactory condition to                            undergo the procedure.                           After obtaining informed consent, the colonoscope  was passed under direct vision. Throughout the                            procedure, the patient's blood pressure, pulse, and                            oxygen saturations were monitored continuously. The                            Model CF-HQ190L [id number]) scope was introduced                            through the anus and advanced to the the terminal                            ileum, with  identification of the appendiceal                            orifice and IC valve. The colonoscopy was performed                            without difficulty. The patient tolerated the                            procedure well. The quality of the bowel                            preparation was good. The terminal ileum, ileocecal                            valve, appendiceal orifice, and rectum were                            photographed. Scope In: 8:47:15 AM Scope Out: 9:02:43 AM Scope Withdrawal Time: 0 hours 13 minutes 54 seconds  Total Procedure Duration: 0 hours 15 minutes 28 seconds  Findings:                 The perianal and digital rectal examinations were                            normal.                           A 5 mm polyp was found in the descending colon. The                            polyp was sessile. The polyp was removed with a                            cold snare. Resection and retrieval were complete.                           A 4 mm polyp was found in the recto-sigmoid colon.  The polyp was flat and grossly consistent with a                            benign hyperplastic polyp. The polyp was removed                            with a cold snare. Resection and retrieval were                            complete.                           The terminal ileum appeared normal.                           Non-bleeding internal hemorrhoids were found during                            retroflexion. The hemorrhoids were small.                           The exam was otherwise without abnormality.                           Biopsies for histology were taken with a cold                            forceps from the right colon, left colon and                            transverse colon for evaluation of microscopic                            colitis. Complications:            No immediate complications. Estimated blood loss:                             Minimal. Estimated Blood Loss:     Estimated blood loss was minimal. Impression:               - One 5 mm polyp in the descending colon, removed                            with a cold snare. Resected and retrieved.                           - One 4 mm polyp at the recto-sigmoid colon,                            removed with a cold snare. Resected and retrieved.                           - The examined portion of the ileum was normal.                           -  Non-bleeding internal hemorrhoids.                           - The examination was otherwise normal.                           - Biopsies were taken with a cold forceps from the                            right colon, left colon and transverse colon for                            evaluation of microscopic colitis. Recommendation:           - Patient has a contact number available for                            emergencies. The signs and symptoms of potential                            delayed complications were discussed with the                            patient. Return to normal activities tomorrow.                            Written discharge instructions were provided to the                            patient.                           - Resume previous diet.                           - Continue present medications.                           - No aspirin, ibuprofen, naproxen, or other                            non-steroidal anti-inflammatory drugs for 2 weeks                            after polyp removal.                           - Await pathology results. Remo Lipps P. Deriona Altemose, MD 04/24/2016 9:06:26 AM This report has been signed electronically.

## 2016-04-25 ENCOUNTER — Telehealth: Payer: Self-pay | Admitting: *Deleted

## 2016-04-25 NOTE — Telephone Encounter (Signed)
  Follow up Call-  Call back number 04/24/2016  Post procedure Call Back phone  # [phone]  Permission to leave phone message Yes  Some recent data might be hidden     Patient questions:  Do you have a fever, pain , or abdominal swelling? No. Pain Score  0 *  Have you tolerated food without any problems? Yes.    Have you been able to return to your normal activities? Yes.    Do you have any questions about your discharge instructions: Diet   No. Medications  No. Follow up visit  No.  Do you have questions or concerns about your Care? No.  Actions: * If pain score is 4 or above: No action needed, pain <4.

## 2016-04-30 ENCOUNTER — Other Ambulatory Visit: Payer: Self-pay

## 2016-04-30 ENCOUNTER — Encounter: Payer: Self-pay | Admitting: Family Medicine

## 2016-04-30 DIAGNOSIS — R197 Diarrhea, unspecified: Secondary | ICD-10-CM

## 2016-04-30 MED ORDER — COLESTIPOL HCL 1 G PO TABS: 1.0000 g | ORAL_TABLET | Freq: Two times a day (BID) | ORAL | Status: DC

## 2016-05-02 NOTE — Telephone Encounter (Signed)
Pricilla Riffle LPN also sent PA request to [facility]. Waiting for response.

## 2016-05-07 ENCOUNTER — Telehealth: Payer: Self-pay | Admitting: Gastroenterology

## 2016-05-08 ENCOUNTER — Other Ambulatory Visit: Payer: Self-pay

## 2016-05-08 MED ORDER — COLESTIPOL HCL 1 G PO TABS: 1.0000 g | ORAL_TABLET | Freq: Two times a day (BID) | ORAL | 3 refills | Status: DC | PRN

## 2016-05-08 NOTE — Telephone Encounter (Signed)
Per Dr Havery Moros note on pathology report pt needs Rx for Colestid 1g BID prn. Sent Rx to [facility] in [place]. Pt instructed to check with pharmacy this afternoon for script.

## 2016-05-16 ENCOUNTER — Telehealth: Payer: Self-pay | Admitting: *Deleted

## 2016-05-16 NOTE — Telephone Encounter (Signed)
ON 04-20-2016, Spoke to representative  And the Prior authorization was done and the Xifaxan was approved. However, the copay for the patient was $400.00.Marland Kitchen They were calling the patient to advise and ask if he wanted the medication.  I wast old the patient declined the medication. Too high of a co-pay.

## 2016-06-04 ENCOUNTER — Encounter: Payer: Self-pay | Admitting: Family Medicine

## 2016-06-04 ENCOUNTER — Ambulatory Visit (INDEPENDENT_AMBULATORY_CARE_PROVIDER_SITE_OTHER): Payer: BLUE CROSS/BLUE SHIELD | Admitting: Family Medicine

## 2016-06-04 VITALS — BP 124/84 | HR 73 | Temp 97.0°F | Resp 16 | Wt 249.8 lb

## 2016-06-04 DIAGNOSIS — J069 Acute upper respiratory infection, unspecified: Secondary | ICD-10-CM | POA: Diagnosis not present

## 2016-06-04 DIAGNOSIS — R509 Fever, unspecified: Secondary | ICD-10-CM

## 2016-06-04 DIAGNOSIS — J019 Acute sinusitis, unspecified: Secondary | ICD-10-CM

## 2016-06-04 MED ORDER — AMOXICILLIN-POT CLAVULANATE 875-125 MG PO TABS
1.0000 | ORAL_TABLET | Freq: Two times a day (BID) | ORAL | 0 refills | Status: DC
Start: 2016-06-04 — End: 2016-09-12

## 2016-06-04 NOTE — Progress Notes (Signed)
Pre visit review using our clinic review tool, if applicable. No additional management support is needed unless otherwise documented below in the visit note. 

## 2016-06-04 NOTE — Progress Notes (Signed)
OFFICE VISIT  06/04/2016   CC:  Chief Complaint  Patient presents with  . URI    Head and chest congestion, cough x 3-4 weeks.   HPI:    Patient is a 40 y.o.  male who presents for ongoing resp sx's.   Has had nasal cong/runny nose, sinus pressure.  HA.  Some ST in mornings. Last several days has had more cough, chest tightness.  Denies wheezing or SOB.  Temp 101.2 yesterday was his first fever with this.  Feels "wiped out" last couple days.   Past Medical History:  Diagnosis Date  . Allergy   . Anxiety    alpraz started about 2011  . IBS (irritable bowel syndrome)   . Insomnia   . Seasonal allergic rhinitis     Past Surgical History:  Procedure Laterality Date  . COLONOSCOPY W/ POLYPECTOMY  04/24/2016   polyps x 2= hyperplastic.  Random colon bx's NEG.  Recall 11 yrs (2028, age 80).    Outpatient Medications Prior to Visit  Medication Sig Dispense Refill  . ALPRAZolam (XANAX) 1 MG tablet TAKE ONE TO TWO TABLETS BY MOUTH ONCE DAILY AS NEEDED FOR ANXIETY /INSOMNIA (Patient taking differently: takes 1/2 to 1 tab qhs) 45 tablet 5  . colestipol (COLESTID) 1 g tablet Take 1 tablet (1 g total) by mouth 2 (two) times daily as needed. For IBS. 60 tablet 3   Facility-Administered Medications Prior to Visit  Medication Dose Route Frequency Provider Last Rate Last Dose  . 0.9 %  sodium chloride infusion  500 mL Intravenous Continuous Manus Gunning, MD        No Known Allergies  ROS As per HPI  PE: Blood pressure 124/84, pulse 73, temperature 97 F (36.1 C), temperature source Temporal, resp. rate 16, weight 249 lb 12.8 oz (113.3 kg), SpO2 96 %. VS: noted--normal. Gen: alert, NAD, NONTOXIC APPEARING. HEENT: eyes without injection, drainage, or swelling.  Ears: EACs clear, TMs with normal light reflex and landmarks.  Nose: Clear rhinorrhea, with some dried, crusty exudate adherent to mildly injected mucosa.  No purulent d/c.  No paranasal sinus TTP.  No facial  swelling.  Throat and mouth without focal lesion.  No pharyngial swelling, erythema, or exudate.   Neck: supple, no LAD.   LUNGS: CTA bilat, nonlabored resps.   CV: RRR, no m/r/g. EXT: no c/c/e SKIN: no rash  LABS:  none  IMPRESSION AND PLAN:  URI with cough, prolonged, with acute worsening the last few days. Suspect possible bacterial sinus infection. No sign of RAD or pneumonia on exam. Augmentin 875mg  bid x 10d rx'd today. Get otc generic robitussin DM OR Mucinex DM and use as directed on the packaging for cough and congestion. Use otc generic saline nasal spray 2-3 times per day to irrigate/moisturize your nasal passages.  An After Visit Summary was printed and given to the patient.  FOLLOW UP: Return if symptoms worsen or fail to improve.  Signed:  Crissie Sickles, MD           06/04/2016

## 2016-06-08 ENCOUNTER — Encounter: Payer: BLUE CROSS/BLUE SHIELD | Admitting: Gastroenterology

## 2016-06-14 ENCOUNTER — Ambulatory Visit: Payer: BLUE CROSS/BLUE SHIELD | Admitting: Gastroenterology

## 2016-08-28 ENCOUNTER — Other Ambulatory Visit: Payer: Self-pay | Admitting: Family Medicine

## 2016-08-28 NOTE — Telephone Encounter (Signed)
Needs o/v for f/u anxiety. Will do rx for 30d supply with 1 RF.  Needs f/u before he runs out of these.-thx

## 2016-08-28 NOTE — Telephone Encounter (Signed)
Fax from [facility].  RF request for alprazolam LOV: 01/12/15 - last f/u for anxiety Next ov: None Last written: 02/27/16 #45 w/ 5Rf  Please advise. Thanks.

## 2016-08-29 NOTE — Telephone Encounter (Signed)
Patient notified and verbalized understanding.  Patient will schedule appointment.

## 2016-09-12 ENCOUNTER — Ambulatory Visit (INDEPENDENT_AMBULATORY_CARE_PROVIDER_SITE_OTHER): Payer: 59 | Admitting: Family Medicine

## 2016-09-12 ENCOUNTER — Encounter: Payer: Self-pay | Admitting: Family Medicine

## 2016-09-12 VITALS — BP 116/78 | HR 75 | Temp 98.2°F | Resp 16 | Ht 73.0 in | Wt 252.5 lb

## 2016-09-12 DIAGNOSIS — F5105 Insomnia due to other mental disorder: Secondary | ICD-10-CM

## 2016-09-12 DIAGNOSIS — F419 Anxiety disorder, unspecified: Secondary | ICD-10-CM

## 2016-09-12 DIAGNOSIS — F411 Generalized anxiety disorder: Secondary | ICD-10-CM | POA: Diagnosis not present

## 2016-09-12 NOTE — Progress Notes (Signed)
OFFICE VISIT  09/12/2016   CC:  Chief Complaint  Patient presents with  . Follow-up    Anxiety   HPI:    Patient is a 41 y.o. male who presents for f/u of GAD with situational anxiety. Takes 1 alprazolam in evenings and this helps sufficiently with built-up anxiety in daytime and helps him sleep. No adverse side effects.  Occ takes another 1/2-1 xanax during daytime if really stressed.  Past Medical History:  Diagnosis Date  . Allergy   . Anxiety    alpraz started about 2011  . IBS (irritable bowel syndrome)   . Insomnia   . Seasonal allergic rhinitis     Past Surgical History:  Procedure Laterality Date  . COLONOSCOPY W/ POLYPECTOMY  04/24/2016   polyps x 2= hyperplastic.  Random colon bx's NEG.  Recall 11 yrs (2028, age 65).    Outpatient Medications Prior to Visit  Medication Sig Dispense Refill  . ALPRAZolam (XANAX) 1 MG tablet TAKE ONE TO TWO TABLETS BY MOUTH ONCE DAILY AS NEEDED FOR ANXIETY /INSOMNIA 45 tablet 1  . colestipol (COLESTID) 1 g tablet Take 1 tablet (1 g total) by mouth 2 (two) times daily as needed. For IBS. 60 tablet 3  . amoxicillin-clavulanate (AUGMENTIN) 875-125 MG tablet Take 1 tablet by mouth 2 (two) times daily. (Patient not taking: Reported on 09/12/2016) 20 tablet 0   Facility-Administered Medications Prior to Visit  Medication Dose Route Frequency Provider Last Rate Last Dose  . 0.9 %  sodium chloride infusion  500 mL Intravenous Continuous Manus Gunning, MD        No Known Allergies  ROS As per HPI  PE: Blood pressure 116/78, pulse 75, temperature 98.2 F (36.8 C), temperature source Oral, resp. rate 16, height 6\' 1"  (1.854 m), weight 252 lb 8 oz (114.5 kg), SpO2 95 %. Wt Readings from Last 2 Encounters:  09/12/16 252 lb 8 oz (114.5 kg)  06/04/16 249 lb 12.8 oz (113.3 kg)    Gen: alert, oriented x 4, affect pleasant.  Lucid thinking and conversation noted. HEENT: PERRLA, EOMI.   Neck: no LAD, mass, or thyromegaly. CV:  RRR, no m/r/g LUNGS: CTA bilat, nonlabored. NEURO: no tremor or tics noted on observation.  Coordination intact. CN 2-12 grossly intact bilaterally, strength 5/5 in all extremeties.  No ataxia.   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 Not in need of RF of xanax at this time but will likely need this in 4-6 weeks. He'll call our office for request of this med RF when needed.  No rx given to pt today.  An After Visit Summary was printed and given to the patient.  FOLLOW UP: Return in about 6 months (around 03/12/2017) for annual CPE (fasting).  Signed:  Crissie Sickles, MD           09/12/2016

## 2016-09-12 NOTE — Progress Notes (Signed)
Pre visit review using our clinic review tool, if applicable. No additional management support is needed unless otherwise documented below in the visit note. 

## 2016-09-25 ENCOUNTER — Other Ambulatory Visit: Payer: Self-pay | Admitting: Gastroenterology

## 2016-11-02 ENCOUNTER — Telehealth: Payer: Self-pay

## 2016-11-02 NOTE — Telephone Encounter (Signed)
Lvm for patient, apparently he had called and was routed to another nurses voicemail. Stated he had a question. Asked him to please call our office back so that we can address his question.

## 2016-11-02 NOTE — Telephone Encounter (Signed)
Sorry to hear he is having an issue with this bill. We certainly did not expect this to not be covered by his insurance and it is not an experimental test. I will write a letter on his behalf and get it done in the next few days, he will then need to file an appeal to his insurance company. Thanks

## 2016-11-02 NOTE — Telephone Encounter (Signed)
This patient called and he has been dealing with the non-payment issue of the test, Calprotectin, fecal. It is for $300.00 and the insurance deems this "experimental". I did let him know that you have done letters in the past and sent them to the patient to hopefully help with this insurance issue. I apologized to the patient and told him I would let him know if you were able to help with this. Thanks.

## 2016-11-05 ENCOUNTER — Telehealth: Payer: Self-pay | Admitting: Gastroenterology

## 2016-11-05 ENCOUNTER — Other Ambulatory Visit: Payer: Self-pay | Admitting: Family Medicine

## 2016-11-05 NOTE — Telephone Encounter (Signed)
Patient called in stating his insurance will not cover fecal calprotectin ordered from last July.  I wrote a letter of appeal in hopes they will cover it. Almyra Free can you please send this to the patient and tell him he needs to initiate an appeal if he has not done so already? I'm sorry this has happened to him please let him know this test is not experiment and commonly used. Thanks

## 2016-11-06 NOTE — Telephone Encounter (Signed)
Left vm for patient that Dr. Havery Moros did a letter of appeals and will mail this out to him today. Apologized for any inconvenience this may have caused him.

## 2017-01-11 ENCOUNTER — Encounter: Payer: Self-pay | Admitting: Family Medicine

## 2017-01-11 ENCOUNTER — Ambulatory Visit (INDEPENDENT_AMBULATORY_CARE_PROVIDER_SITE_OTHER): Payer: 59 | Admitting: Family Medicine

## 2017-01-11 VITALS — BP 124/75 | HR 68 | Temp 97.8°F | Resp 16 | Ht 73.0 in | Wt 248.2 lb

## 2017-01-11 DIAGNOSIS — J301 Allergic rhinitis due to pollen: Secondary | ICD-10-CM

## 2017-01-11 DIAGNOSIS — J209 Acute bronchitis, unspecified: Secondary | ICD-10-CM | POA: Diagnosis not present

## 2017-01-11 MED ORDER — AZITHROMYCIN 250 MG PO TABS: ORAL_TABLET | ORAL | 0 refills | Status: DC

## 2017-01-11 MED ORDER — HYDROCODONE-HOMATROPINE 5-1.5 MG/5ML PO SYRP: 5.0000 mL | ORAL_SOLUTION | Freq: Three times a day (TID) | ORAL | 0 refills | Status: DC | PRN

## 2017-01-11 MED ORDER — PREDNISONE 20 MG PO TABS
ORAL_TABLET | ORAL | 0 refills | Status: DC
Start: 2017-01-11 — End: 2017-05-17

## 2017-01-11 NOTE — Progress Notes (Signed)
OFFICE VISIT  01/11/2017   CC:  Chief Complaint  Patient presents with  . Allergies    fatigue, cough, runny nose, itchy eyes x 5 weeks   HPI:    Patient is a 41 y.o.  male who presents for respiratory symptoms, pt with suspicion of allergic rhinitis. Onset 5 weeks ago--cough.  Poor sleep secondary to cough a lot at night--energy level suffering. Some runny nose and itchy eyes but minimal.  Occ phlegm production.  Sneezing a lot. Sx's worse in outside setting, esp on baseball field. Tried all otc nonsedating antihist, mucinex, nyquil.  Tried nasal steroid in the past but not helping this. No wheezing.  No SOB.  No fevers.  Past Medical History:  Diagnosis Date  . Allergy   . Anxiety    alpraz started about 2011  . IBS (irritable bowel syndrome)   . Insomnia   . Seasonal allergic rhinitis     Past Surgical History:  Procedure Laterality Date  . COLONOSCOPY W/ POLYPECTOMY  04/24/2016   polyps x 2= hyperplastic.  Random colon bx's NEG.  Recall 11 yrs (2028, age 74).    Outpatient Medications Prior to Visit  Medication Sig Dispense Refill  . ALPRAZolam (XANAX) 1 MG tablet TAKE ONE TO TWO TABLETS BY MOUTH ONCE DAILY AS NEEDED FOR  ANXIETY 45 tablet 5  . colestipol (COLESTID) 1 g tablet TAKE ONE TABLET BY MOUTH TWICE DAILY AS NEEDED FOR  IBS 60 tablet 3   Facility-Administered Medications Prior to Visit  Medication Dose Route Frequency Provider Last Rate Last Dose  . 0.9 %  sodium chloride infusion  500 mL Intravenous Continuous Armbruster, Renelda Loma, MD        No Known Allergies  ROS As per HPI  PE: Blood pressure 124/75, pulse 68, temperature 97.8 F (36.6 C), temperature source Oral, resp. rate 16, height 6\' 1"  (1.854 m), weight 248 lb 4 oz (112.6 kg), SpO2 98 %. Gen: Alert, well appearing.  Patient is oriented to person, place, time, and situation. AFFECT: pleasant, lucid thought and speech. VS: noted--normal. Gen: alert, NAD, NONTOXIC APPEARING. HEENT: eyes  without injection, drainage, or swelling.  Ears: EACs clear, TMs with normal light reflex and landmarks.  Nose: Clear rhinorrhea, with some dried, crusty exudate adherent to swollen non-injected mucosa.  No purulent d/c.  No paranasal sinus TTP.  No facial swelling.  Throat and mouth without focal lesion.  No pharyngial swelling, erythema, or exudate.   Neck: supple, no LAD.   LUNGS: CTA bilat, nonlabored resps.   CV: RRR, no m/r/g. EXT: no c/c/e SKIN: no rash  LABS:  none  IMPRESSION AND PLAN:  Allergic rhinitis with acute bronchitis.  Not improving over the course of 5 weeks. Continue daily flonase and non-sedating antihistamine, plus robitussin DM. Add prednisone 40mg  qd x 5d, then 20mg  qd x 5d, then 10mg  qd x 6d. Z-pack ordered. Hycodan syrup; 1 tsp q8h prn, #120 ml.  Signed:  Crissie Sickles, MD           01/11/2017  FOLLOW UP: No Follow-up on file.  Signed:  Crissie Sickles, MD           01/11/2017

## 2017-01-16 ENCOUNTER — Other Ambulatory Visit: Payer: Self-pay | Admitting: Gastroenterology

## 2017-01-17 NOTE — Telephone Encounter (Signed)
Pt was last seen in 03/2016 with Janett Billow. Are you ok to refill Colestid?

## 2017-01-17 NOTE — Telephone Encounter (Signed)
Yes, 90 day supply with 3 refills. Thanks

## 2017-02-12 ENCOUNTER — Encounter (HOSPITAL_COMMUNITY): Payer: Self-pay

## 2017-02-12 ENCOUNTER — Emergency Department (HOSPITAL_COMMUNITY)
Admission: EM | Admit: 2017-02-12 | Discharge: 2017-02-12 | Disposition: A | Discharge status: Home / Self Care | Payer: 59 | Attending: Emergency Medicine | Admitting: Emergency Medicine

## 2017-02-12 ENCOUNTER — Emergency Department (HOSPITAL_COMMUNITY): Payer: 59

## 2017-02-12 DIAGNOSIS — W293XXA Contact with powered garden and outdoor hand tools and machinery, initial encounter: Secondary | ICD-10-CM | POA: Insufficient documentation

## 2017-02-12 DIAGNOSIS — L03115 Cellulitis of right lower limb: Secondary | ICD-10-CM | POA: Diagnosis not present

## 2017-02-12 DIAGNOSIS — F419 Anxiety disorder, unspecified: Secondary | ICD-10-CM | POA: Diagnosis not present

## 2017-02-12 DIAGNOSIS — Y9389 Activity, other specified: Secondary | ICD-10-CM | POA: Diagnosis not present

## 2017-02-12 DIAGNOSIS — Y998 Other external cause status: Secondary | ICD-10-CM | POA: Diagnosis not present

## 2017-02-12 DIAGNOSIS — Z23 Encounter for immunization: Secondary | ICD-10-CM | POA: Insufficient documentation

## 2017-02-12 DIAGNOSIS — S91012A Laceration without foreign body, left ankle, initial encounter: Secondary | ICD-10-CM | POA: Diagnosis not present

## 2017-02-12 DIAGNOSIS — L0390 Cellulitis, unspecified: Secondary | ICD-10-CM

## 2017-02-12 DIAGNOSIS — Y929 Unspecified place or not applicable: Secondary | ICD-10-CM | POA: Diagnosis not present

## 2017-02-12 DIAGNOSIS — S99911A Unspecified injury of right ankle, initial encounter: Secondary | ICD-10-CM | POA: Diagnosis present

## 2017-02-12 MED ORDER — CLINDAMYCIN HCL 150 MG PO CAPS: 450.0000 mg | ORAL_CAPSULE | Freq: Three times a day (TID) | ORAL | 0 refills | Status: DC

## 2017-02-12 MED ORDER — SULFAMETHOXAZOLE-TRIMETHOPRIM 800-160 MG PO TABS
1.0000 | ORAL_TABLET | Freq: Once | ORAL | Status: AC
  Administered 2017-02-12: 1 via ORAL
  Filled 2017-02-12: qty 1

## 2017-02-12 MED ORDER — SULFAMETHOXAZOLE-TRIMETHOPRIM 800-160 MG PO TABS: 1.0000 | ORAL_TABLET | Freq: Two times a day (BID) | ORAL | 0 refills | Status: AC

## 2017-02-12 MED ORDER — TETANUS-DIPHTH-ACELL PERTUSSIS 5-2.5-18.5 LF-MCG/0.5 IM SUSP
0.5000 mL | Freq: Once | INTRAMUSCULAR | Status: AC
  Administered 2017-02-12: 0.5 mL via INTRAMUSCULAR
  Filled 2017-02-12: qty 0.5

## 2017-02-12 MED ORDER — CLINDAMYCIN HCL 150 MG PO CAPS: 450.0000 mg | ORAL_CAPSULE | Freq: Once | ORAL | Status: DC

## 2017-02-12 MED ORDER — BACITRACIN ZINC 500 UNIT/GM EX OINT
TOPICAL_OINTMENT | Freq: Two times a day (BID) | CUTANEOUS | Status: DC
  Administered 2017-02-12: 1 via TOPICAL
  Filled 2017-02-12: qty 2.7

## 2017-02-12 NOTE — ED Provider Notes (Signed)
[facility] DEPT Provider Note   CSN: [id number] Arrival date & time: 02/12/17  7096     History   Chief Complaint Chief Complaint  Patient presents with  . Wound Infection    HPI George Lam is a 41 y.o. male with wound to the right ankle 6 days. Patient reports that actually 6 days ago he was cut on the right ankle with a weed cutter. He reports that since then he had been applying Neosporin ointment and covering the wound. He reports that yesterday he started having progressively worsening pain, redness/swelling, warmth to the area. He reports that the symptoms have improved today but given the history of swelling, he came to the ED to be evaluated. He did not take any medications for the pain. He did note some purulent drainage from wound. Patient reports that pain was worsened with ambulation. Patient does not have a history of diabetes or any immunocompromised state. Patient denies any fever. Patient thinks that it has been approximate 5 years since his last tetanus. Since he is unsure, we'll plan to update in the department.  The history is provided by the patient.    Past Medical History:  Diagnosis Date  . Allergy   . Anxiety    alpraz started about 2011  . IBS (irritable bowel syndrome)   . Insomnia   . Seasonal allergic rhinitis     Patient Active Problem List   Diagnosis Date Noted  . Chronic diarrhea 04/11/2016    Past Surgical History:  Procedure Laterality Date  . COLONOSCOPY W/ POLYPECTOMY  04/24/2016   polyps x 2= hyperplastic.  Random colon bx's NEG.  Recall 11 yrs (2028, age 63).       Home Medications    Prior to Admission medications   Medication Sig Start Date End Date Taking? Authorizing Provider  ALPRAZolam (XANAX) 1 MG tablet TAKE ONE TO TWO TABLETS BY MOUTH ONCE DAILY AS NEEDED FOR  ANXIETY 11/05/16   McGowen, Adrian Blackwater, MD  azithromycin (ZITHROMAX) 250 MG tablet 2 tabs po qd x 1d, then 1 tab po qd x 4d 01/11/17   McGowen, Adrian Blackwater, MD    colestipol (COLESTID) 1 g tablet TAKE 1 TABLET BY MOUTH TWICE DAILY AS NEEDED FOR  IBS 01/18/17   Armbruster, Renelda Loma, MD  HYDROcodone-homatropine [facility]) 5-1.5 MG/5ML syrup Take 5 mLs by mouth every 8 (eight) hours as needed for cough. 01/11/17   McGowen, Adrian Blackwater, MD  predniSONE (DELTASONE) 20 MG tablet 2 tabs po qd x 5d, then 1 tab po qd x 5d, then 1/2 tab po qd x 6 days 01/11/17   Tammi Sou, MD  sulfamethoxazole-trimethoprim (BACTRIM DS,SEPTRA DS) 800-160 MG tablet Take 1 tablet by mouth 2 (two) times daily. 02/12/17 02/19/17  Volanda Napoleon, PA-C    Family History Family History  Problem Relation Age of Onset  . Arthritis Father   . Colon cancer Neg Hx   . Prostate cancer Neg Hx     Social History Social History  Substance Use Topics  . Smoking status: Never Smoker  . Smokeless tobacco: Never Used  . Alcohol use No     Comment: OCC     Allergies   Patient has no known allergies.   Review of Systems Review of Systems  Constitutional: Negative for fever.  Skin: Positive for wound.     Physical Exam Updated Vital Signs BP 125/89 (BP Location: Right Arm)   Pulse 71   Temp 97.6 F (36.4 C) (  Oral)   Resp 16   Ht 6\' 1"  (1.854 m)   Wt 108.9 kg (240 lb)   SpO2 97%   BMI 31.66 kg/m   Physical Exam  Constitutional: He appears well-developed and well-nourished.  Sitting comfortably on examination table  HENT:  Head: Normocephalic and atraumatic.  Eyes: Conjunctivae and EOM are normal. Right eye exhibits no discharge. Left eye exhibits no discharge. No scleral icterus.  Cardiovascular:  Pulses:      Dorsalis pedis pulses are 2+ on the right side, and 2+ on the left side.  Pulmonary/Chest: Effort normal.  Neurological: He is alert.  Skin: Skin is warm and dry. Capillary refill takes less than 2 seconds. Laceration noted.  Right ankle with old 3 cm laceration across the dorsal aspect just inferior to the joint with surrounding erythema and soft tissue  swelling.  Slight warmth overlying the dorsal aspect of the ankle. Full range of motion of right ankle without difficulty.  Psychiatric: He has a normal mood and affect. His speech is normal and behavior is normal.  Nursing note and vitals reviewed.       ED Treatments / Results  Labs (all labs ordered are listed, but only abnormal results are displayed) Labs Reviewed - No data to display  EKG  EKG Interpretation None       Radiology Dg Ankle Complete Right  Result Date: 02/12/2017 CLINICAL DATA:  Laceration 1 week ago.  Swelling and pain . EXAM: RIGHT ANKLE - COMPLETE 3+ VIEW COMPARISON:  No recent . FINDINGS: Soft tissue swelling. No evidence of fracture dislocation. No acute bony abnormality. IMPRESSION: Soft tissue swelling.  No acute or focal bony abnormality. Electronically Signed   By: Marcello Moores  Register   On: 02/12/2017 09:00    Procedures Procedures (including critical care time)  Medications Ordered in ED Medications  bacitracin ointment (not administered)  sulfamethoxazole-trimethoprim (BACTRIM DS,SEPTRA DS) 800-160 MG per tablet 1 tablet (not administered)  Tdap (BOOSTRIX) injection 0.5 mL (0.5 mLs Intramuscular Given 02/12/17 0902)     Initial Impression / Assessment and Plan / ED Course  I have reviewed the triage vital signs and the nursing notes.  Pertinent labs & imaging results that were available during my care of the patient were reviewed by me and considered in my medical decision making (see chart for details).     41 year old male who presents with a laceration and wound to the right ankle that occurred 6 days ago after being cut by a weed eater. Comes in today with worsening pain, redness/swelling, warmth. Patient is afebrile, non-toxic appearing, sitting comfortably on examination table. Vital signs reviewed and stable.  Patient has no history of diabetes or any immunocompromised state. Patient tetanus is not up-to-date. Will plan to update in the  department. Will obtain x-ray of right ankle for evaluation of acute fracture, foreign body, infection. Patient offered analgesics but he declined at this time.  X-ray reviewed. Negative for any acute fracture, foreign body or evidence of cutaneous gas. Discussed results with patient. Wound was thoroughly irrigated with sterile saline. Attempted debridement of the wound with only a small amount of material removed. Most of it is fibrotic and attached. Will plan to apply bacitracin and sterile dressing. First dose of antibiotic given an apartment. Patient prescribed oral antibodies. Instructed to follow up with his primary care doctor in the next 48 hours for wound recheck. If he cannot get in with his primary care doctor instructed him return the emergency Department for wound recheck.  Instructed to return sooner if any worsening condition. Strict return precautions discussed. Patient expresses understanding and agreement to plan.   Final Clinical Impressions(s) / ED Diagnoses   Final diagnoses:  Cellulitis of right lower extremity  Wound cellulitis    New Prescriptions New Prescriptions   SULFAMETHOXAZOLE-TRIMETHOPRIM (BACTRIM DS,SEPTRA DS) 800-160 MG TABLET    Take 1 tablet by mouth 2 (two) times daily.     Volanda Napoleon, PA-C 02/12/17 1851    Forde Dandy, MD 02/12/17 Einar Crow

## 2017-02-12 NOTE — Discharge Instructions (Signed)
Take antibiotics as directed.  Keep the wound clean and dry. If you apply a new dressing make sure that the wound is completely dry before applying the new dressing.  Follow-up with her primary care doctor in the next 24-48 hours for reevaluation of the wound. If he cannot get in with her eat and return the emergency per min 2 days for wound recheck.  Return to the emergency department for any worsening pain, redness/swelling that begins to spread up the foot or up the leg, fever, drainage from the wound or any other worsening or concerning symptoms.

## 2017-02-12 NOTE — ED Triage Notes (Signed)
Per Pt, pt is coming from home with complaint of swelling, redness, and pain noted to the right lateral ankle secondary to a weed eater laceration that happened a week ago. Pt reports increased swelling and pain for the last week. Denies fevers.

## 2017-03-22 ENCOUNTER — Encounter: Payer: Self-pay | Admitting: Gastroenterology

## 2017-05-17 ENCOUNTER — Encounter: Payer: Self-pay | Admitting: Family Medicine

## 2017-05-17 ENCOUNTER — Ambulatory Visit (INDEPENDENT_AMBULATORY_CARE_PROVIDER_SITE_OTHER): Payer: 59 | Admitting: Family Medicine

## 2017-05-17 VITALS — BP 129/91 | HR 84 | Temp 98.1°F | Resp 16 | Ht 73.0 in | Wt 245.2 lb

## 2017-05-17 DIAGNOSIS — J302 Other seasonal allergic rhinitis: Secondary | ICD-10-CM | POA: Diagnosis not present

## 2017-05-17 DIAGNOSIS — R69 Illness, unspecified: Secondary | ICD-10-CM

## 2017-05-17 DIAGNOSIS — J0101 Acute recurrent maxillary sinusitis: Secondary | ICD-10-CM

## 2017-05-17 DIAGNOSIS — J209 Acute bronchitis, unspecified: Secondary | ICD-10-CM

## 2017-05-17 DIAGNOSIS — J111 Influenza due to unidentified influenza virus with other respiratory manifestations: Secondary | ICD-10-CM

## 2017-05-17 LAB — POC INFLUENZA A&B (BINAX/QUICKVUE)
Influenza A, POC: NEGATIVE
Influenza B, POC: NEGATIVE

## 2017-05-17 MED ORDER — AMOXICILLIN-POT CLAVULANATE 875-125 MG PO TABS: 1.0000 | ORAL_TABLET | Freq: Two times a day (BID) | ORAL | 0 refills | Status: DC

## 2017-05-17 MED ORDER — METHYLPREDNISOLONE ACETATE 80 MG/ML IJ SUSP
80.0000 mg | Freq: Once | INTRAMUSCULAR | Status: AC
  Administered 2017-05-17: 80 mg via INTRAMUSCULAR

## 2017-05-17 MED ORDER — PREDNISONE 20 MG PO TABS: ORAL_TABLET | ORAL | 0 refills | Status: DC

## 2017-05-17 MED ORDER — HYDROCODONE-HOMATROPINE 5-1.5 MG/5ML PO SYRP: 5.0000 mL | ORAL_SOLUTION | Freq: Three times a day (TID) | ORAL | 0 refills | Status: DC | PRN

## 2017-05-17 NOTE — Progress Notes (Signed)
OFFICE VISIT  05/19/2017   CC:  Chief Complaint  Patient presents with  . Headache  . Allergic Rhinitis    HPI:    Patient is a 41 y.o. Caucasian male who presents for "sinus issues/HAs". About 1 mo of nasal and facial/sinus congestion, feeling malaise, coughing productive of green/thick mucous. Bad fatigue, didn't get out of bed yesterday.  HA's between and behind the eyes.   Feels chest tightness, a little wheezing yesterday.  No SOB or CP.  Fevers last 4-5 d, to 102.4 yesterday. ST on and off.  No face or upper teeth pain.  +PND is copious per pt.   Past Medical History:  Diagnosis Date  . Allergy   . Anxiety    alpraz started about 2011  . IBS (irritable bowel syndrome)   . Insomnia   . Seasonal allergic rhinitis     Past Surgical History:  Procedure Laterality Date  . COLONOSCOPY W/ POLYPECTOMY  04/24/2016   polyps x 2= hyperplastic.  Random colon bx's NEG.  Recall 11 yrs (2028, age 35).    Outpatient Medications Prior to Visit  Medication Sig Dispense Refill  . ALPRAZolam (XANAX) 1 MG tablet TAKE ONE TO TWO TABLETS BY MOUTH ONCE DAILY AS NEEDED FOR  ANXIETY 45 tablet 5  . colestipol (COLESTID) 1 g tablet TAKE 1 TABLET BY MOUTH TWICE DAILY AS NEEDED FOR  IBS 90 tablet 3  . azithromycin (ZITHROMAX) 250 MG tablet 2 tabs po qd x 1d, then 1 tab po qd x 4d (Patient not taking: Reported on 05/17/2017) 6 tablet 0  . HYDROcodone-homatropine (HYCODAN) 5-1.5 MG/5ML syrup Take 5 mLs by mouth every 8 (eight) hours as needed for cough. (Patient not taking: Reported on 05/17/2017) 120 mL 0  . predniSONE (DELTASONE) 20 MG tablet 2 tabs po qd x 5d, then 1 tab po qd x 5d, then 1/2 tab po qd x 6 days (Patient not taking: Reported on 05/17/2017) 18 tablet 0   Facility-Administered Medications Prior to Visit  Medication Dose Route Frequency Provider Last Rate Last Dose  . 0.9 %  sodium chloride infusion  500 mL Intravenous Continuous Armbruster, Carlota Raspberry, MD        No Known  Allergies  ROS As per HPI  PE: Blood pressure (!) 129/91, pulse 84, temperature 98.1 F (36.7 C), temperature source Oral, resp. rate 16, height 6\' 1"  (1.854 m), weight 245 lb 4 oz (111.2 kg), SpO2 97 %.. VS: noted--normal. Gen: alert, NAD, NONTOXIC APPEARING. HEENT: eyes without injection, drainage, or swelling.  Ears: EACs clear, TMs with normal light reflex and landmarks.  Nose: Clear rhinorrhea, with some dried, crusty exudate adherent to mildly injected mucosa.  No purulent d/c.  No paranasal sinus TTP.  No facial swelling.  Throat and mouth without focal lesion.  No pharyngial swelling, erythema, or exudate.   Neck: supple, no LAD.   LUNGS: CTA bilat, nonlabored resps.   CV: RRR, no m/r/g. EXT: no c/c/e SKIN: no rash   LABS:    Chemistry      Component Value Date/Time   NA 139 05/26/2015 1045   K 4.3 05/26/2015 1045   CL 104 05/26/2015 1045   CO2 28 05/26/2015 1045   BUN 17 05/26/2015 1045   CREATININE 0.98 05/26/2015 1045      Component Value Date/Time   CALCIUM 9.9 05/26/2015 1045   ALKPHOS 69 02/15/2016 0956   AST 25 02/15/2016 0956   ALT 57 (H) 02/15/2016 0956   BILITOT 1.0  02/15/2016 0956     Rapid flu today: NEG  IMPRESSION AND PLAN:  1) Allergic rhinitis with acute infectious sinusitis, possibly with influenza-like illness superimposed on this in the last week. FLu testing in office NEG. Appears non-toxic. Depo medrol 80 mg IM here today. Starting tomorrow: prednisone 40 mg qd x 5d, then 20 mg qd x 5d. Augmentin 875mg  bid x 10d. Hycodan syrup, #120 ml.  An After Visit Summary was printed and given to the patient.  FOLLOW UP: Return if symptoms worsen or fail to improve.  Signed:  Crissie Sickles, MD           05/19/2017

## 2017-05-23 ENCOUNTER — Other Ambulatory Visit: Payer: Self-pay

## 2017-05-23 DIAGNOSIS — R932 Abnormal findings on diagnostic imaging of liver and biliary tract: Secondary | ICD-10-CM

## 2017-05-29 ENCOUNTER — Ambulatory Visit (HOSPITAL_COMMUNITY)
Admission: RE | Admit: 2017-05-29 | Discharge: 2017-05-29 | Disposition: A | Discharge status: Home / Self Care | Payer: 59 | Source: Ambulatory Visit | Attending: Gastroenterology | Admitting: Gastroenterology

## 2017-05-29 DIAGNOSIS — R932 Abnormal findings on diagnostic imaging of liver and biliary tract: Secondary | ICD-10-CM | POA: Diagnosis not present

## 2017-05-29 DIAGNOSIS — K824 Cholesterolosis of gallbladder: Secondary | ICD-10-CM | POA: Diagnosis not present

## 2017-05-29 DIAGNOSIS — K760 Fatty (change of) liver, not elsewhere classified: Secondary | ICD-10-CM | POA: Insufficient documentation

## 2017-05-30 ENCOUNTER — Encounter: Payer: Self-pay | Admitting: Gastroenterology

## 2017-05-30 ENCOUNTER — Other Ambulatory Visit: Payer: Self-pay

## 2017-05-30 ENCOUNTER — Other Ambulatory Visit (INDEPENDENT_AMBULATORY_CARE_PROVIDER_SITE_OTHER): Payer: 59

## 2017-05-30 ENCOUNTER — Other Ambulatory Visit: Payer: Self-pay | Admitting: Family Medicine

## 2017-05-30 ENCOUNTER — Ambulatory Visit (INDEPENDENT_AMBULATORY_CARE_PROVIDER_SITE_OTHER): Payer: 59 | Admitting: Gastroenterology

## 2017-05-30 VITALS — BP 106/76 | HR 68 | Ht 72.0 in | Wt 250.0 lb

## 2017-05-30 DIAGNOSIS — R945 Abnormal results of liver function studies: Secondary | ICD-10-CM | POA: Diagnosis not present

## 2017-05-30 DIAGNOSIS — K580 Irritable bowel syndrome with diarrhea: Secondary | ICD-10-CM | POA: Diagnosis not present

## 2017-05-30 DIAGNOSIS — R7989 Other specified abnormal findings of blood chemistry: Secondary | ICD-10-CM

## 2017-05-30 DIAGNOSIS — K760 Fatty (change of) liver, not elsewhere classified: Secondary | ICD-10-CM

## 2017-05-30 LAB — HEPATIC FUNCTION PANEL
ALBUMIN: 4.4 g/dL (ref 3.5–5.2)
ALT: 64 U/L — ABNORMAL HIGH (ref 0–53)
AST: 18 U/L (ref 0–37)
Alkaline Phosphatase: 56 U/L (ref 39–117)
BILIRUBIN DIRECT: 0.2 mg/dL (ref 0.0–0.3)
BILIRUBIN TOTAL: 0.9 mg/dL (ref 0.2–1.2)
TOTAL PROTEIN: 7.1 g/dL (ref 6.0–8.3)

## 2017-05-30 MED ORDER — ELUXADOLINE 100 MG PO TABS: 100.0000 mg | ORAL_TABLET | Freq: Two times a day (BID) | ORAL | 0 refills | Status: DC

## 2017-05-30 NOTE — Progress Notes (Signed)
HPI :  42 year old male here for a follow-up visit. I initially saw him in July 2017, with complaints of chronic loose stools. He had a workup which included blood work for celiac disease which was negative. We discussed a trial of a low FODMAP diet and trial of rifaximin, he could not afford the rifaximin. He eventually underwent a colonoscopy in September 2017 which showed healthy colon, no evidence of microscopic colitis or IBD, he had a benign hyperplastic polyp and small hemorrhoids.  He was placed on a trial of Colestid since his colonoscopy was done, and he states this is definitely reduced stool frequency, although not completely eliminated his symptoms. He previously was having anywhere from 7-8 bowel movement today. On a good day he will have perhaps one bowel movement per day, occasionally has bad days where he has upwards of 6 bowel movements a day. On average much better on Colestid then without it. He continues to have mild abdominal cramping associated with a bowel movement, and is relieved with a bowel movement. He has gained about 25 pounds over the past year he thinks. He denies any blood in his stools.   Otherwise he was noted to have a mild ALT elevation on his intake visit. Workup for this with serology was negative. He had an ultrasound showing fatty liver as likely etiology, he also incidentally had small gallbladder polyps noted. He had a recent ultrasound done yesterday is a 1 year follow-up exam for the gallbladder polyps. He had persistent steatosis liver, gallbladder polyps have not changed in size. He denies any right upper quadrant pain or postprandial abdominal pain in general. He denies any alcohol use routinely - he states he consumes alcohol about 3-4 times per year most.  Colonoscopy - 04/24/16 - normal ileum, normal colon other than 2 small polyps - hyperplastic, small hemorhoids, biopsies taken - no microscopic colitis    Past Medical History:  Diagnosis Date  .  Allergy   . Anxiety    alpraz started about 2011  . Fatty liver   . IBS (irritable bowel syndrome)   . Insomnia   . Seasonal allergic rhinitis      Past Surgical History:  Procedure Laterality Date  . COLONOSCOPY W/ POLYPECTOMY  04/24/2016   polyps x 2= hyperplastic.  Random colon bx's NEG.  Recall 11 yrs (2028, age 78).   Family History  Problem Relation Age of Onset  . Arthritis Father   . Colon cancer Neg Hx   . Prostate cancer Neg Hx    Social History  Substance Use Topics  . Smoking status: Never Smoker  . Smokeless tobacco: Never Used  . Alcohol use No     Comment: OCC   Current Outpatient Prescriptions  Medication Sig Dispense Refill  . ALPRAZolam (XANAX) 1 MG tablet TAKE ONE TO TWO TABLETS BY MOUTH ONCE DAILY AS NEEDED FOR  ANXIETY 45 tablet 5  . colestipol (COLESTID) 1 g tablet TAKE 1 TABLET BY MOUTH TWICE DAILY AS NEEDED FOR  IBS 90 tablet 3  . Eluxadoline (VIBERZI) 100 MG TABS Take 1 tablet (100 mg total) by mouth 2 (two) times daily. 32 tablet 0   Current Facility-Administered Medications  Medication Dose Route Frequency Provider Last Rate Last Dose  . 0.9 %  sodium chloride infusion  500 mL Intravenous Continuous Armbruster, Carlota Raspberry, MD       No Known Allergies   Review of Systems: All systems reviewed and negative except where noted in HPI.  US Abdomen Limited  Result Date: 05/29/2017 CLINICAL DATA:  Follow-up gallbladder polyps identified on prior imaging. EXAM: ULTRASOUND ABDOMEN LIMITED RIGHT UPPER QUADRANT COMPARISON:  02/20/2016. FINDINGS: Gallbladder: Two visible gallbladder polyps, each on the order of 2 mm, one located in the fundus and other in the mid body, unchanged since the prior ultrasound. No shadowing gallstones or echogenic sludge. No gallbladder wall thickening or pericholecystic fluid. Negative sonographic Murphy's sign according to the ultrasound technologist. Common bile duct: Diameter: Approximately 5 mm. Liver: Diffusely  increased and coarsened echotexture. Focal normal hepatic echotexture adjacent to the gallbladder. No significant focal hepatic parenchymal abnormality. Portal vein is patent on color Doppler imaging with normal direction of blood flow towards the liver. IMPRESSION: 1. Two very small gallbladder polyps which are unchanged since July, 2017. No further imaging follow-up is felt necessary. 2. Diffuse hepatic steatosis with focal sparing adjacent to the gallbladder. No significant focal hepatic parenchymal abnormalities. Electronically Signed   By: Evangeline Dakin M.D.   On: 05/29/2017 15:01   Lab Results  Component Value Date   ALT 57 (H) 02/15/2016   AST 25 02/15/2016   ALKPHOS 69 02/15/2016   BILITOT 1.0 02/15/2016    Lab Results  Component Value Date   CREATININE 0.98 05/26/2015   BUN 17 05/26/2015   NA 139 05/26/2015   K 4.3 05/26/2015   CL 104 05/26/2015   CO2 28 05/26/2015    Lab Results  Component Value Date   WBC 7.7 05/11/2015   HGB 16.3 05/11/2015   HCT 48.2 05/11/2015   MCV 90.7 05/11/2015   PLT 267.0 05/11/2015     Physical Exam: BP 106/76 (BP Location: Left Arm, Patient Position: Sitting, Cuff Size: Normal)   Pulse 68   Ht 6' (1.829 m) Comment: height measured without shoes  Wt 250 lb (113.4 kg)   BMI 33.91 kg/m  Constitutional: Pleasant,well-developed, male in no acute distress. HEENT: Normocephalic and atraumatic. Conjunctivae are normal. No scleral icterus. Neck supple.  Cardiovascular: Normal rate, regular rhythm.  Pulmonary/chest: Effort normal and breath sounds normal. No wheezing, rales or rhonchi. Abdominal: Soft, nondistended, nontender.  There are no masses palpable. No hepatomegaly. Extremities: no edema Lymphadenopathy: No cervical adenopathy noted. Neurological: Alert and oriented to person place and time. Skin: Skin is warm and dry. No rashes noted. Psychiatric: Normal mood and affect. Behavior is normal.   ASSESSMENT AND PLAN: 41 year old  male here for reassessment following issues:  IBS-D - reviewed his prior workup with him, his current symptoms, course to date. Colestid has provided some benefit to him and he generally feels better, however continue his to have occasional bad days with frequent loose stools. I discussed other options with him. I offered him a trial of Viberzi 100 mg twice daily to see if this would provide better control of his symptoms. We discussed potential side effects, and he wanted to try it. I gave him a two-week trial of this with a free sample. I asked him to stop the Colestid while he takes this. It works well for him and he likes it he can call me back to fill a prescription. He agreed.  Fatty liver - discussed the spectrum of fatty liver disease with him, risks for the development of Karlene Lineman and cirrhosis. He does not drink alcohol much at all, recommend he continue to abstain. We discussed the importance of weight loss over time, he will work on this. We'll recheck LFTs today, and check this roughly once per year if stable.  He agreed with the plan.  Fraser Cellar, MD [facility] Pager [phone]

## 2017-05-30 NOTE — Patient Instructions (Signed)
If you are age 40 or older, your body mass index should be between 23-30. Your Body mass index is 33.91 kg/m. If this is out of the aforementioned range listed, please consider follow up with your Primary Care Provider.  If you are age 41 or younger, your body mass index should be between 19-25. Your Body mass index is 33.91 kg/m. If this is out of the aformentioned range listed, please consider follow up with your Primary Care Provider.   Your physician has requested that you go to the basement for the following lab work before leaving today: LFT  We have given you samples of the following medication to take: Viberzi, 100mg , take 2x daily  Thank you.

## 2017-05-31 NOTE — Telephone Encounter (Signed)
George Lam  RF request for alprazolam LOV: 09/12/16 Next ov: None Last written: 11/05/16 #45 w/ 5RF  Please advise. Thanks.

## 2017-05-31 NOTE — Telephone Encounter (Signed)
OK, needs o/v to f/u for this anxiety med in the next 1-2 months. I'll print rx for 1 mo supply with 1 additional RF.-thx

## 2017-05-31 NOTE — Telephone Encounter (Signed)
Pt advised and voiced understanding. Apt made for 06/03/17 at 8:45am. Rx faxed.

## 2017-06-03 ENCOUNTER — Encounter: Payer: Self-pay | Admitting: *Deleted

## 2017-06-03 ENCOUNTER — Encounter: Payer: Self-pay | Admitting: Family Medicine

## 2017-06-03 ENCOUNTER — Ambulatory Visit (INDEPENDENT_AMBULATORY_CARE_PROVIDER_SITE_OTHER): Payer: 59 | Admitting: Family Medicine

## 2017-06-03 VITALS — BP 127/73 | HR 72 | Temp 98.2°F | Resp 16 | Ht 72.0 in | Wt 251.0 lb

## 2017-06-03 DIAGNOSIS — F411 Generalized anxiety disorder: Secondary | ICD-10-CM | POA: Diagnosis not present

## 2017-06-03 DIAGNOSIS — Z79899 Other long term (current) drug therapy: Secondary | ICD-10-CM

## 2017-06-03 DIAGNOSIS — F418 Other specified anxiety disorders: Secondary | ICD-10-CM | POA: Diagnosis not present

## 2017-06-03 NOTE — Progress Notes (Signed)
OFFICE VISIT  06/03/2017   CC:  Chief Complaint  Patient presents with  . Follow-up    Anxiety   HPI:    Patient is a 41 y.o. Caucasian male who presents for 6 mo f/u GAD and situational anxiety. Anxiety well controlled on xanax bid.  No adverse side effects. Most recent xanax taken this AM, also last night. Nothing else should show up.  ROS: no HAs, no rash, no excessive drowsiness.  Past Medical History:  Diagnosis Date  . Allergy   . Anxiety    alpraz started about 2011  . Fatty liver   . IBS (irritable bowel syndrome)   . Insomnia   . Seasonal allergic rhinitis     Past Surgical History:  Procedure Laterality Date  . COLONOSCOPY W/ POLYPECTOMY  04/24/2016   polyps x 2= hyperplastic.  Random colon bx's NEG.  Recall 11 yrs (2028, age 66).    Outpatient Medications Prior to Visit  Medication Sig Dispense Refill  . ALPRAZolam (XANAX) 1 MG tablet TAKE 1 TO 2 TABLETS BY MOUTH ONCE DAILY AS NEEDED FOR ANXIETY 45 tablet 1  . colestipol (COLESTID) 1 g tablet TAKE 1 TABLET BY MOUTH TWICE DAILY AS NEEDED FOR  IBS 90 tablet 3  . Eluxadoline (VIBERZI) 100 MG TABS Take 1 tablet (100 mg total) by mouth 2 (two) times daily. (Patient not taking: Reported on 06/03/2017) 32 tablet 0  . 0.9 %  sodium chloride infusion      No facility-administered medications prior to visit.     No Known Allergies  ROS As per HPI  PE: Blood pressure 127/73, pulse 72, temperature 98.2 F (36.8 C), temperature source Oral, resp. rate 16, height 6' (1.829 m), weight 251 lb (113.9 kg), SpO2 97 %. Gen: Alert, well appearing.  Patient is oriented to person, place, time, and situation. AFFECT: pleasant, lucid thought and speech. No further exam today.  LABS:    Chemistry      Component Value Date/Time   NA 139 05/26/2015 1045   K 4.3 05/26/2015 1045   CL 104 05/26/2015 1045   CO2 28 05/26/2015 1045   BUN 17 05/26/2015 1045   CREATININE 0.98 05/26/2015 1045      Component Value Date/Time    CALCIUM 9.9 05/26/2015 1045   ALKPHOS 56 05/30/2017 1042   AST 18 05/30/2017 1042   ALT 64 (H) 05/30/2017 1042   BILITOT 0.9 05/30/2017 1042      IMPRESSION AND PLAN:  1) GAD with situational anxiety, chronic xanax use. CSC discussed/signed today. Reviewed [place] online controlled substance database today: no suspicious activity. UDS today: xanax should show up.  An After Visit Summary was printed and given to the patient.  FOLLOW UP: Return in about 6 months (around 12/01/2017) for annual CPE (fasting).  Signed:  Crissie Sickles, MD           06/03/2017

## 2017-06-06 LAB — PAIN MGMT, PROFILE 8 W/CONF, U
6 ACETYLMORPHINE: NEGATIVE ng/mL (ref <10)
ALCOHOL METABOLITES: NEGATIVE ng/mL (ref <500)
ALPHAHYDROXYTRIAZOLAM: NEGATIVE ng/mL (ref <50)
AMPHETAMINES: NEGATIVE ng/mL (ref <500)
Alphahydroxyalprazolam: 366 ng/mL — ABNORMAL HIGH (ref <25)
Alphahydroxymidazolam: NEGATIVE ng/mL (ref <50)
Aminoclonazepam: NEGATIVE ng/mL (ref <25)
BUPRENORPHINE, URINE: NEGATIVE ng/mL (ref <5)
Benzodiazepines: POSITIVE ng/mL — AB (ref <100)
COCAINE METABOLITE: NEGATIVE ng/mL (ref <150)
Creatinine: 155.7 mg/dL
HYDROXYETHYLFLURAZEPAM: NEGATIVE ng/mL (ref <50)
Lorazepam: NEGATIVE ng/mL (ref <50)
MDMA: NEGATIVE ng/mL (ref <500)
Marijuana Metabolite: NEGATIVE ng/mL (ref <20)
Nordiazepam: NEGATIVE ng/mL (ref <50)
OPIATES: NEGATIVE ng/mL (ref <100)
OXAZEPAM: NEGATIVE ng/mL (ref <50)
OXIDANT: NEGATIVE ug/mL (ref <200)
Oxycodone: NEGATIVE ng/mL (ref <100)
TEMAZEPAM: NEGATIVE ng/mL (ref <50)
pH: 5.06 (ref 4.5–9.0)

## 2017-07-21 ENCOUNTER — Other Ambulatory Visit: Payer: Self-pay | Admitting: Gastroenterology

## 2017-07-21 ENCOUNTER — Other Ambulatory Visit: Payer: Self-pay | Admitting: Family Medicine

## 2017-07-22 ENCOUNTER — Other Ambulatory Visit: Payer: Self-pay

## 2017-09-02 ENCOUNTER — Encounter: Payer: Self-pay | Admitting: Family Medicine

## 2017-09-02 ENCOUNTER — Ambulatory Visit (INDEPENDENT_AMBULATORY_CARE_PROVIDER_SITE_OTHER): Payer: 59 | Admitting: Family Medicine

## 2017-09-02 VITALS — BP 118/80 | HR 100 | Temp 99.3°F | Resp 16 | Ht 72.0 in | Wt 255.5 lb

## 2017-09-02 DIAGNOSIS — J101 Influenza due to other identified influenza virus with other respiratory manifestations: Secondary | ICD-10-CM

## 2017-09-02 DIAGNOSIS — R6889 Other general symptoms and signs: Secondary | ICD-10-CM | POA: Diagnosis not present

## 2017-09-02 LAB — POC INFLUENZA A&B (BINAX/QUICKVUE)
INFLUENZA A, POC: NEGATIVE
Influenza B, POC: POSITIVE — AB

## 2017-09-02 MED ORDER — HYDROCODONE-HOMATROPINE 5-1.5 MG/5ML PO SYRP: ORAL_SOLUTION | ORAL | 0 refills | Status: DC

## 2017-09-02 MED ORDER — OSELTAMIVIR PHOSPHATE 75 MG PO CAPS: 75.0000 mg | ORAL_CAPSULE | Freq: Two times a day (BID) | ORAL | 0 refills | Status: DC

## 2017-09-02 NOTE — Patient Instructions (Signed)
Get otc generic robitussin DM OR Mucinex DM and use as directed on the packaging for cough and congestion. Use otc generic saline nasal spray 2-3 times per day to irrigate/moisturize your nasal passages.  FLUIDS FLUIDS FLUIDS !

## 2017-09-02 NOTE — Progress Notes (Signed)
OFFICE VISIT  09/02/2017   CC:  Chief Complaint  Patient presents with  . Cough    body aches, fever? chills  . URI   HPI:    Patient is a 42 y.o.  male who presents for fatigue, body aches, sore throat. Onset 5-6d ago, ST and nasal congestion and some body aches, fatigue--did not come on that strong.  No pain in face or upper teeth. Sinus pressure type of HA this AM.   Much worse sx's the last 3d:  Subjective fevers last 3d, cough worse. No temp checked.  Now with nausea, hot/cold, achy, terrible coughing.  Cough productive in mornings, but throughout the day is not productive.  No wheeze. No vomiting. Hard to tell if his loose bm's with this have been a sx of the illness or part of his IBS.  Has not had flu vaccine this season.  Past Medical History:  Diagnosis Date  . Allergy   . Anxiety    alpraz started about 2011  . Fatty liver   . IBS (irritable bowel syndrome)   . Insomnia   . Seasonal allergic rhinitis     Past Surgical History:  Procedure Laterality Date  . COLONOSCOPY W/ POLYPECTOMY  04/24/2016   polyps x 2= hyperplastic.  Random colon bx's NEG.  Recall 11 yrs (2028, age 89).    Outpatient Medications Prior to Visit  Medication Sig Dispense Refill  . ALPRAZolam (XANAX) 1 MG tablet TAKE 1 TO 2 TABLETS BY MOUTH ONCE DAILY AS NEEDED FOR ANXIETY 45 tablet 5  . colestipol (COLESTID) 1 g tablet TAKE 1 TABLET BY MOUTH TWICE DAILY AS NEEDED FOR  IBS 60 tablet 2   No facility-administered medications prior to visit.     No Known Allergies  ROS As per HPI  PE: Blood pressure 118/80, pulse 100, temperature 99.3 F (37.4 C), temperature source Oral, resp. rate 16, height 6' (1.829 m), weight 255 lb 8 oz (115.9 kg), SpO2 95 %. VS: noted--normal. Gen: alert, NAD, NONTOXIC but TIRED-APPEARING. HEENT: eyes without injection, drainage, or swelling.  Ears: EACs clear, TMs with normal light reflex and landmarks.  Nose: Clear rhinorrhea, with some dried, crusty  exudate adherent to mildly injected mucosa.  No purulent d/c.  No paranasal sinus TTP.  No facial swelling.  Throat and mouth without focal lesion.  No pharyngial swelling, erythema, or exudate.   Neck: supple, no LAD.   LUNGS: CTA bilat, nonlabored resps.   CV: RRR, no m/r/g. EXT: no c/c/e SKIN: no rash    LABS:    Chemistry      Component Value Date/Time   NA 139 05/26/2015 1045   K 4.3 05/26/2015 1045   CL 104 05/26/2015 1045   CO2 28 05/26/2015 1045   BUN 17 05/26/2015 1045   CREATININE 0.98 05/26/2015 1045      Component Value Date/Time   CALCIUM 9.9 05/26/2015 1045   ALKPHOS 56 05/30/2017 1042   AST 18 05/30/2017 1042   ALT 64 (H) 05/30/2017 1042   BILITOT 0.9 05/30/2017 1042      Rapid flu :  POS influenza B today.  IMPRESSION AND PLAN:  Influenza B; Will try tamiflu 75mg  bid x 5d but judging on the worst of his sx's starting 48-72 h ago this may be of very limited benefit. Discussed with pt-- he was in favor of tamiflu trial. Symptomatic care: Get otc generic robitussin DM OR Mucinex DM and use as directed on the packaging for cough and  congestion. Use otc generic saline nasal spray 2-3 times per day to irrigate/moisturize your nasal passages. Hycodan syrup: 1-2 tsp po qhs prn cough/aches.  An After Visit Summary was printed and given to the patient.  FOLLOW UP: Return if symptoms worsen or fail to improve.  Signed:  Crissie Sickles, MD           09/02/2017

## 2017-11-12 ENCOUNTER — Other Ambulatory Visit: Payer: Self-pay | Admitting: Gastroenterology

## 2017-11-13 NOTE — Telephone Encounter (Signed)
Spoke to pt.  He took Viberzi for 2 days and it caused "really bad stomach cramping" so he stopped taking.  He would like to go back to Colestid.  Ok to send Rx with refills?

## 2017-11-13 NOTE — Telephone Encounter (Signed)
Yes that's fine, can refill.

## 2017-11-13 NOTE — Telephone Encounter (Signed)
Called pt and LM to call back to see if Viberzi samples we gave him as his OV in 05-2017 offered any relief.

## 2017-12-17 IMAGING — US US ABDOMEN LIMITED
1 series · 14 of 25 positions shown · non-contrast
Comparison: 02/20/2016.

CLINICAL DATA: Follow-up gallbladder polyps identified on prior
imaging.

EXAM:
ULTRASOUND ABDOMEN LIMITED RIGHT UPPER QUADRANT

[Series 1: us abdomen limited · 0.22mm/px · 14 of 61 slices shown]
[im 1/61]
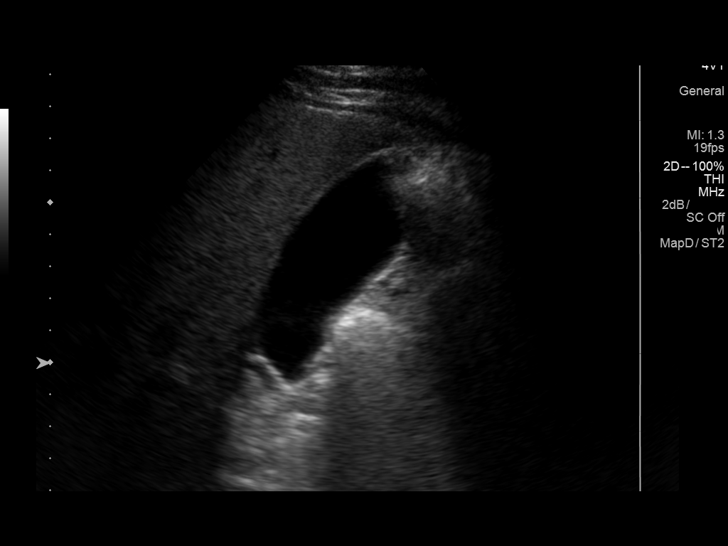
[im 6/61]
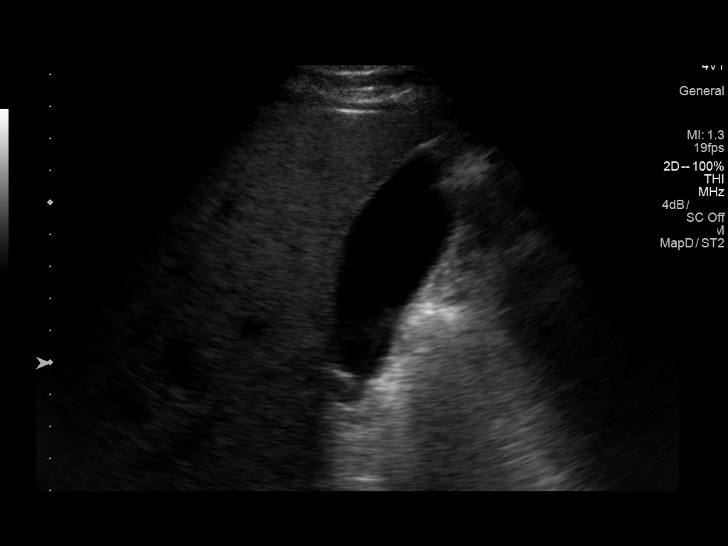
[im 11/61]
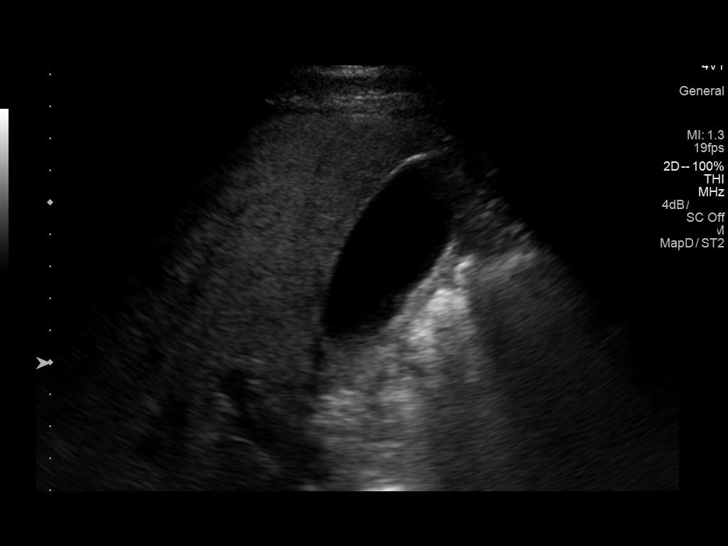
[im 16/61]
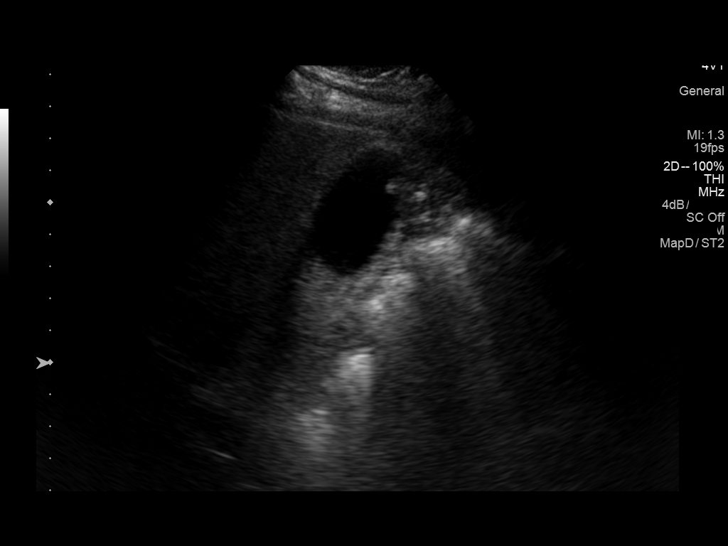
[im 21/61]
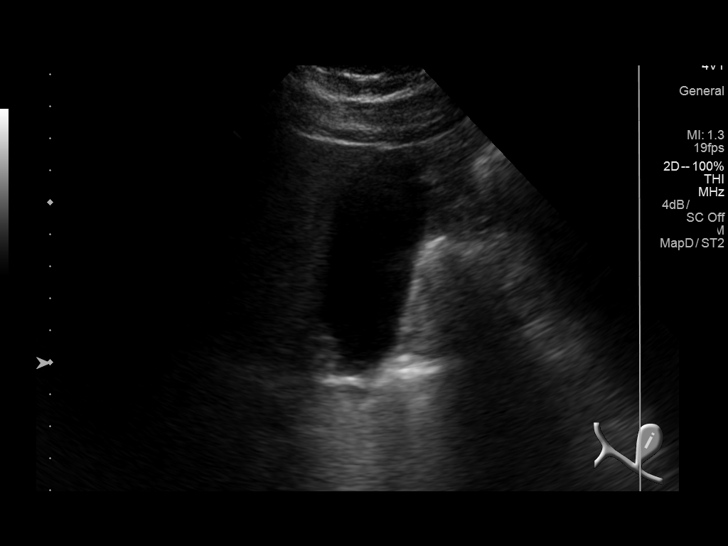
[im 23/61]
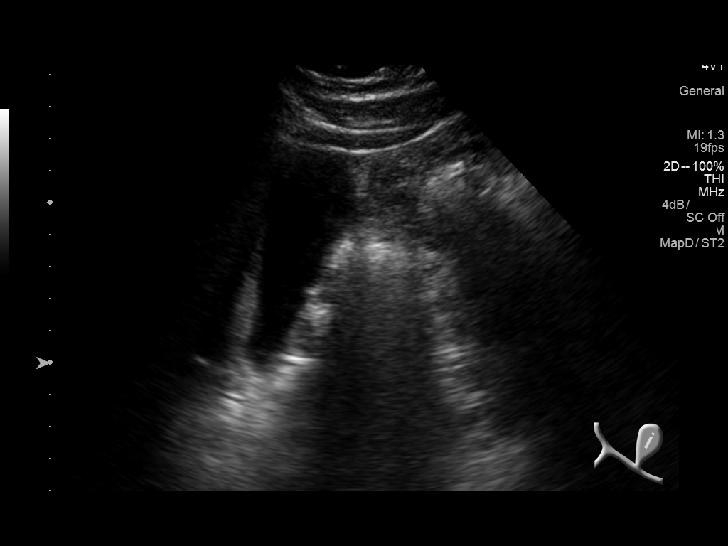
[im 28/61]
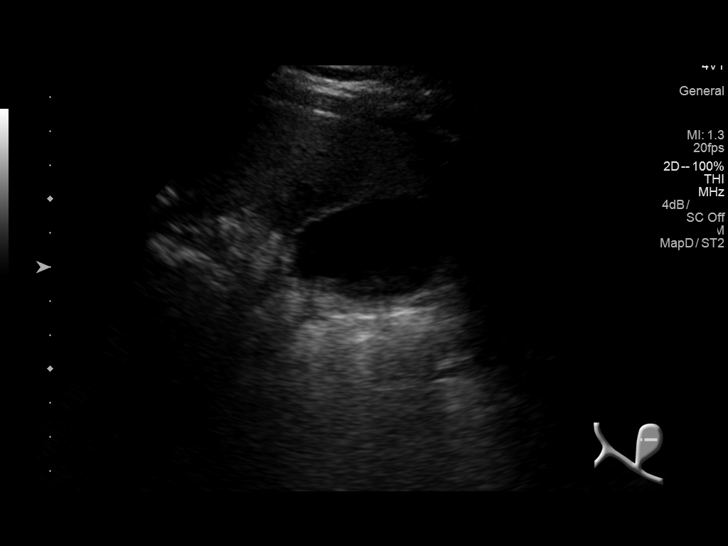
[im 33/61]
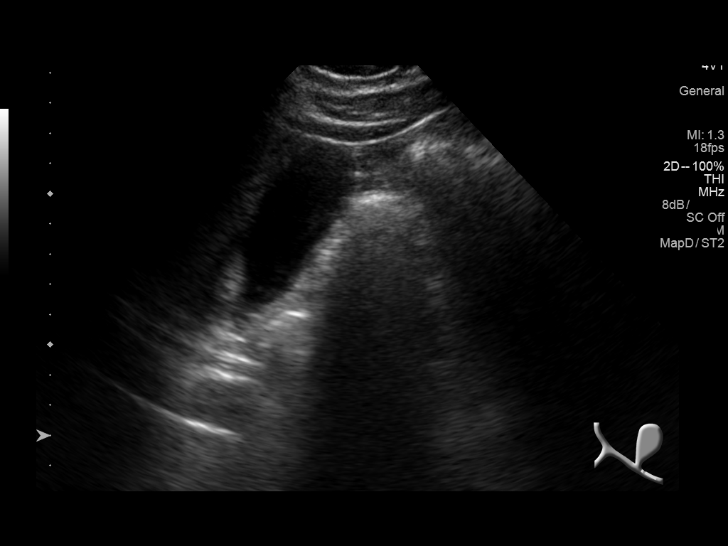
[im 38/61]
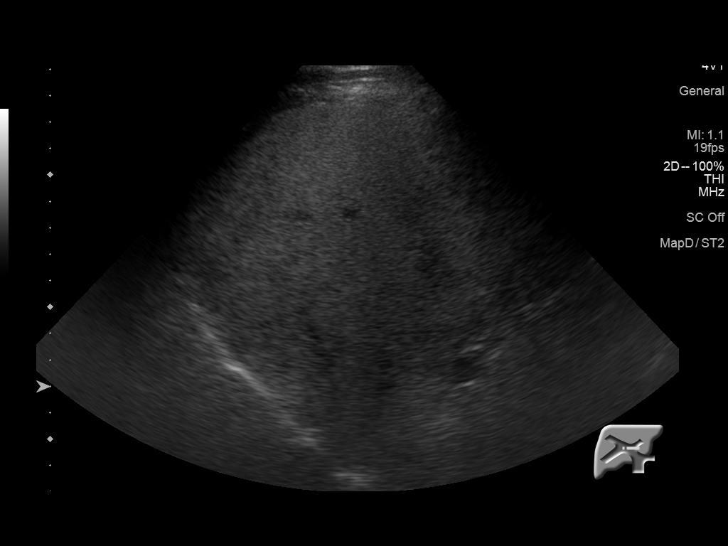
[im 41/61]
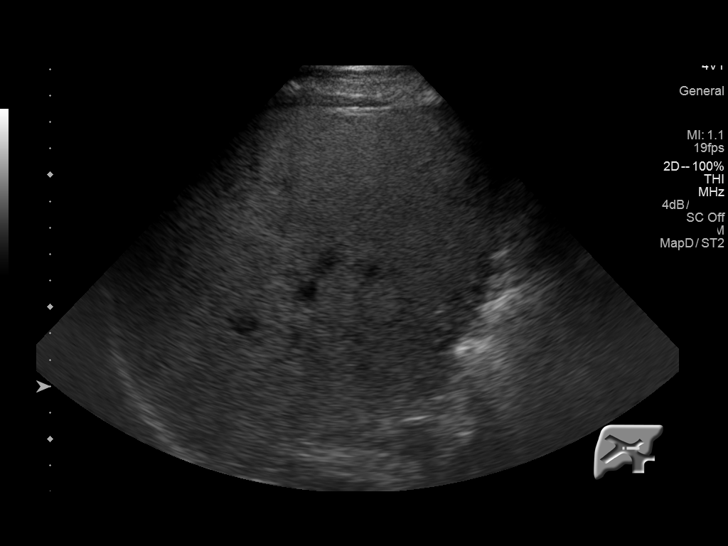
[im 46/61]
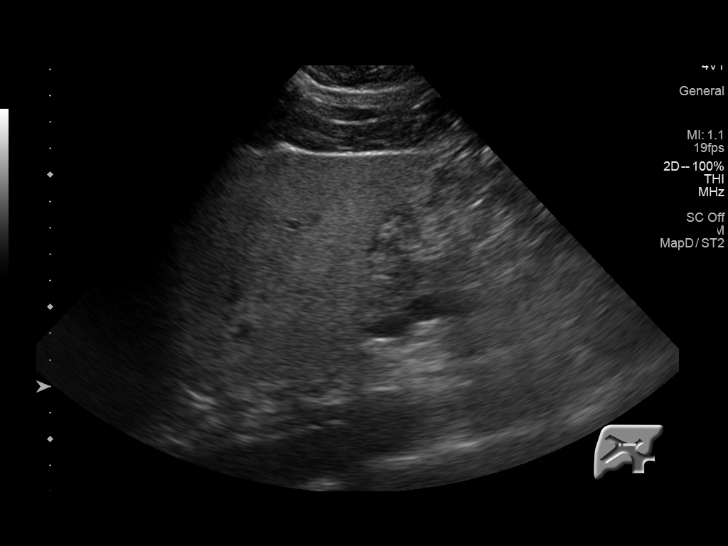
[im 51/61]
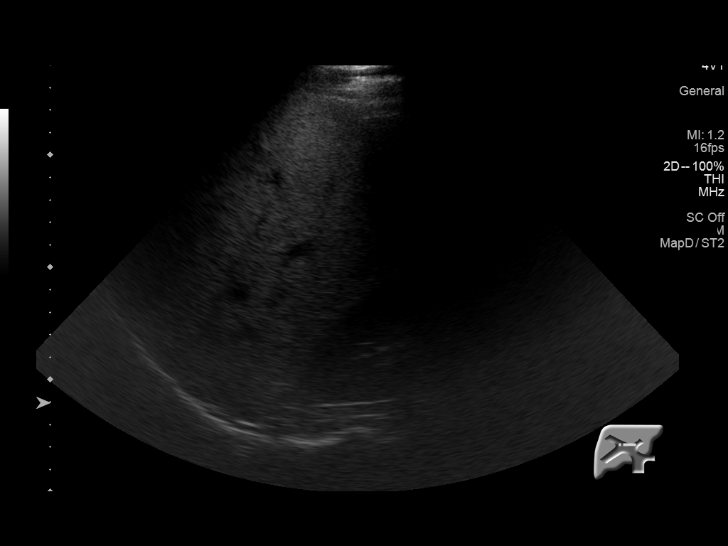
[im 56/61]
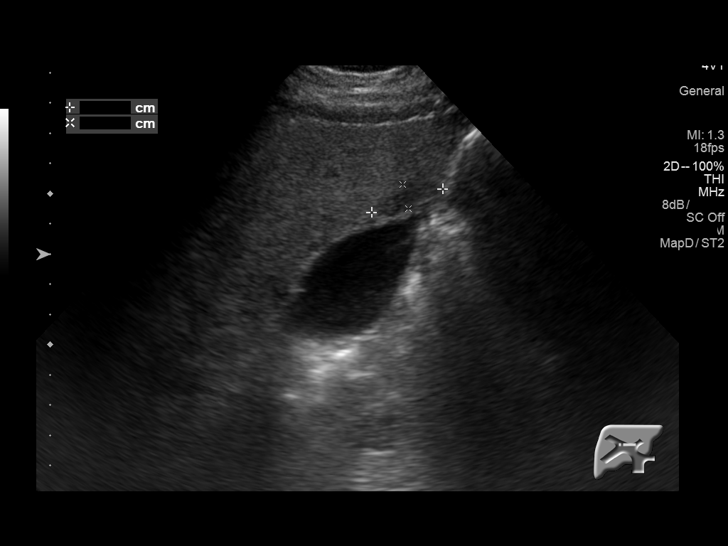
[im 61/61]
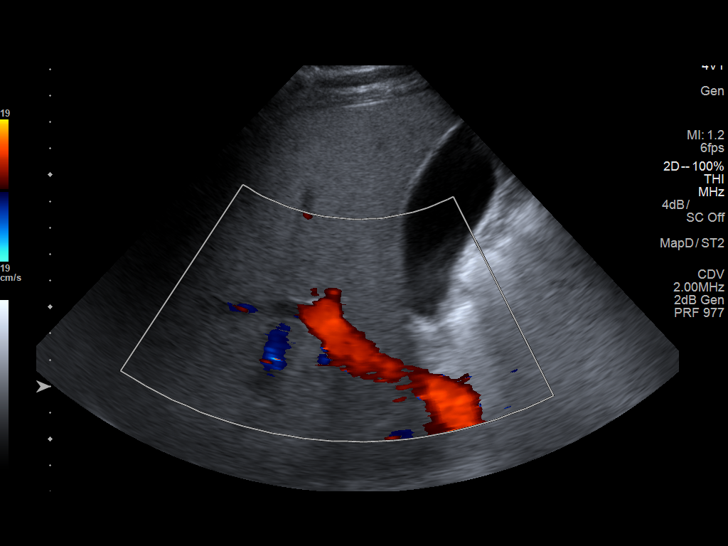

[14 of 25 positions shown; findings below may reference images not displayed]

FINDINGS: Gallbladder:

Two visible gallbladder polyps, each on the order of 2 mm, one
located in the fundus and other in the mid body, unchanged since the
prior ultrasound. No shadowing gallstones or echogenic sludge. No
gallbladder wall thickening or pericholecystic fluid. Negative
sonographic Murphy's sign according to the ultrasound technologist.

Common bile duct:

Diameter: Approximately 5 mm.

Liver:

Diffusely increased and coarsened echotexture. Focal normal hepatic
echotexture adjacent to the gallbladder. No significant focal
hepatic parenchymal abnormality. Portal vein is patent on color
Doppler imaging with normal direction of blood flow towards the
liver.
IMPRESSION: 1. Two very small gallbladder polyps which are unchanged since January 2016. No further imaging follow-up is felt necessary.
2. Diffuse hepatic steatosis with focal sparing adjacent to the
gallbladder. No significant focal hepatic parenchymal abnormalities.

## 2018-02-11 ENCOUNTER — Other Ambulatory Visit: Payer: Self-pay | Admitting: Family Medicine

## 2018-02-12 NOTE — Telephone Encounter (Signed)
[facility] [place]  RF request for alprazolam LOV: 06/03/17 Next ov: None Last written: 07/24/17 #45 w/ 5RF  Please advise. Thanks.

## 2018-02-12 NOTE — Telephone Encounter (Signed)
As per pt's controlled substance contract, I must see him in the office for monitoring of his anxiety and this med every 6 months (I last saw him for this 05/2017). I'll eRx 30 tabs with no RF. Need to see in office before any FURTHER RF's.-thx

## 2018-02-13 NOTE — Telephone Encounter (Signed)
Pt advised and voiced understanding.  Apt made for 02/17/18 at 8:45am.

## 2018-02-17 ENCOUNTER — Encounter: Payer: Self-pay | Admitting: Family Medicine

## 2018-02-17 ENCOUNTER — Ambulatory Visit (INDEPENDENT_AMBULATORY_CARE_PROVIDER_SITE_OTHER): Payer: 59 | Admitting: Family Medicine

## 2018-02-17 VITALS — BP 117/77 | HR 68 | Temp 98.0°F | Resp 16 | Ht 72.0 in | Wt 253.5 lb

## 2018-02-17 DIAGNOSIS — F418 Other specified anxiety disorders: Secondary | ICD-10-CM | POA: Diagnosis not present

## 2018-02-17 DIAGNOSIS — F411 Generalized anxiety disorder: Secondary | ICD-10-CM

## 2018-02-17 DIAGNOSIS — E669 Obesity, unspecified: Secondary | ICD-10-CM

## 2018-02-17 MED ORDER — ALPRAZOLAM 1 MG PO TABS: ORAL_TABLET | ORAL | 1 refills | Status: DC

## 2018-02-17 NOTE — Progress Notes (Signed)
OFFICE VISIT  02/17/2018   CC:  Chief Complaint  Patient presents with  . Follow-up    RCI, pt is fasting.    HPI:    Patient is a 42 y.o.  male who presents for f/u GAD and situational anxiety. Xanax has been well controlled on xanax bid in the past. [place] in place, most recent UDS 06/03/17 showed appropriate results.  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   Doing well on xanax bid still.  Taks it 2 pills a day consistently. Coping with some of his bigger day to day stressors better. Feels like med regimen is still very helpful.  No side effects. No signif alc intake.  Colestid helping well for IBS.  Past Medical History:  Diagnosis Date  . Allergy   . Anxiety    alpraz started about 2011  . Fatty liver    u/s 04/2017  . IBS (irritable bowel syndrome)   . Insomnia   . Seasonal allergic rhinitis     Past Surgical History:  Procedure Laterality Date  . COLONOSCOPY W/ POLYPECTOMY  04/24/2016   polyps x 2= hyperplastic.  Random colon bx's NEG.  Recall 11 yrs (2028, age 47).    Outpatient Medications Prior to Visit  Medication Sig Dispense Refill  . colestipol (COLESTID) 1 g tablet TAKE 1 TABLET BY MOUTH TWICE DAILY AS NEEDED FOR IBS 60 tablet 3  . ALPRAZolam (XANAX) 1 MG tablet TAKE 1 TO 2 TABLETS BY MOUTH ONCE DAILY AS NEEDED FOR ANXIETY 30 tablet 0  . HYDROcodone-homatropine (HYCODAN) 5-1.5 MG/5ML syrup 1-2 tsp po qhs prn cough/aches (Patient not taking: Reported on 02/17/2018) 120 mL 0  . oseltamivir (TAMIFLU) 75 MG capsule Take 1 capsule (75 mg total) by mouth 2 (two) times daily. (Patient not taking: Reported on 02/17/2018) 10 capsule 0   No facility-administered medications prior to visit.     No Known Allergies  ROS As per HPI  PE: Blood pressure 117/77, pulse 68, temperature 98 F (36.7 C), temperature source Oral, resp. rate 16, height 6' (1.829 m), weight 253 lb 8 oz (115 kg), SpO2 97 %. Body mass index is 34.38 kg/m.  Wt Readings from Last 2  Encounters:  02/17/18 253 lb 8 oz (115 kg)  09/02/17 255 lb 8 oz (115.9 kg)    Gen: alert, oriented x 4, affect pleasant.  Lucid thinking and conversation noted. HEENT: PERRLA, EOMI.   Neck: no LAD, mass, or thyromegaly. CV: RRR, no m/r/g LUNGS: CTA bilat, nonlabored. NEURO: no tremor or tics noted on observation.  Coordination intact. CN 2-12 grossly intact bilaterally, strength 5/5 in all extremeties.  No ataxia.  LABS:    Chemistry      Component Value Date/Time   NA 139 05/26/2015 1045   K 4.3 05/26/2015 1045   CL 104 05/26/2015 1045   CO2 28 05/26/2015 1045   BUN 17 05/26/2015 1045   CREATININE 0.98 05/26/2015 1045      Component Value Date/Time   CALCIUM 9.9 05/26/2015 1045   ALKPHOS 56 05/30/2017 1042   AST 18 05/30/2017 1042   ALT 64 (H) 05/30/2017 1042   BILITOT 0.9 05/30/2017 1042     Lab Results  Component Value Date   WBC 7.7 05/11/2015   HGB 16.3 05/11/2015   HCT 48.2 05/11/2015   MCV 90.7 05/11/2015   PLT 267.0 05/11/2015   Lab Results  Component Value Date   CHOL 197 05/11/2015   HDL 37.40 (L) 05/11/2015   LDLCALC  132 (H) 05/11/2015   TRIG 134.0 05/11/2015   CHOLHDL 5 05/11/2015    IMPRESSION AND PLAN:  GAD with situational anxiety: The current medical regimen is effective;  continue present plan and medications. Rx for 6 mo supply of xanax (#180) eRx'd today, with RF x 1. CSC and UDS are UTD.  An After Visit Summary was printed and given to the patient.  FOLLOW UP: Return in about 6 months (around 08/20/2018) for annual CPE (fasting).  Signed:  Crissie Sickles, MD           02/17/2018

## 2018-03-29 ENCOUNTER — Other Ambulatory Visit: Payer: Self-pay | Admitting: Gastroenterology

## 2018-05-19 ENCOUNTER — Telehealth: Payer: Self-pay

## 2018-05-19 DIAGNOSIS — R945 Abnormal results of liver function studies: Principal | ICD-10-CM

## 2018-05-19 DIAGNOSIS — R7989 Other specified abnormal findings of blood chemistry: Secondary | ICD-10-CM

## 2018-05-19 NOTE — Telephone Encounter (Signed)
-----   Message from Larina Bras, [place] sent at 05/31/2017 12:37 PM EDT ----- Needs repeat lfts (see 05/30/17 lft notes)

## 2018-05-19 NOTE — Telephone Encounter (Signed)
Called and LM for pt to go to lab for repeat LFTs.

## 2018-05-22 ENCOUNTER — Other Ambulatory Visit (INDEPENDENT_AMBULATORY_CARE_PROVIDER_SITE_OTHER): Payer: 59

## 2018-05-22 DIAGNOSIS — R945 Abnormal results of liver function studies: Secondary | ICD-10-CM | POA: Diagnosis not present

## 2018-05-22 DIAGNOSIS — R7989 Other specified abnormal findings of blood chemistry: Secondary | ICD-10-CM

## 2018-05-22 LAB — HEPATIC FUNCTION PANEL
ALBUMIN: 4.9 g/dL (ref 3.5–5.2)
ALK PHOS: 67 U/L (ref 39–117)
ALT: 68 U/L — ABNORMAL HIGH (ref 0–53)
AST: 26 U/L (ref 0–37)
BILIRUBIN DIRECT: 0.2 mg/dL (ref 0.0–0.3)
TOTAL PROTEIN: 7.7 g/dL (ref 6.0–8.3)
Total Bilirubin: 1 mg/dL (ref 0.2–1.2)

## 2018-07-03 ENCOUNTER — Encounter: Payer: Self-pay | Admitting: Gastroenterology

## 2018-07-03 ENCOUNTER — Ambulatory Visit (INDEPENDENT_AMBULATORY_CARE_PROVIDER_SITE_OTHER): Payer: 59 | Admitting: Gastroenterology

## 2018-07-03 ENCOUNTER — Other Ambulatory Visit: Payer: Self-pay

## 2018-07-03 ENCOUNTER — Other Ambulatory Visit (INDEPENDENT_AMBULATORY_CARE_PROVIDER_SITE_OTHER): Payer: 59

## 2018-07-03 VITALS — BP 110/80 | HR 68 | Ht 72.0 in | Wt 254.2 lb

## 2018-07-03 DIAGNOSIS — K580 Irritable bowel syndrome with diarrhea: Secondary | ICD-10-CM

## 2018-07-03 DIAGNOSIS — K760 Fatty (change of) liver, not elsewhere classified: Secondary | ICD-10-CM

## 2018-07-03 DIAGNOSIS — E785 Hyperlipidemia, unspecified: Secondary | ICD-10-CM

## 2018-07-03 LAB — LDL CHOLESTEROL, DIRECT: LDL DIRECT: 111 mg/dL

## 2018-07-03 LAB — LIPID PANEL
CHOL/HDL RATIO: 6
CHOLESTEROL: 196 mg/dL (ref 0–200)
HDL: 31.4 mg/dL — ABNORMAL LOW (ref >39.00)
Triglycerides: 448 mg/dL — ABNORMAL HIGH (ref 0.0–149.0)

## 2018-07-03 LAB — HEMOGLOBIN A1C: Hgb A1c MFr Bld: 5.1 % (ref 4.6–6.5)

## 2018-07-03 NOTE — Progress Notes (Signed)
HPI :  42 year old male here for a follow-up visit for IBS and fatty liver. I initially saw him in July 2017. He had a workup which included blood work for celiac disease which was negative. We discussed a trial of a low FODMAP diet and trial of rifaximin, he could not afford the rifaximin. He eventually underwent a colonoscopy in September 2017 which showed healthy colon, no evidence of microscopic colitis or IBD, he had a benign hyperplastic polyp and small hemorrhoids.  He's been on Colestid since her last seen him dosed at 1 g twice daily, this does help him. He has an average 3-4 bowel movements per day, previously upwards of 9 bowel movements a day without it. At her last visit we tried him on a course of fiber Z. He reports he was much too strong for him and he had severe pain after taking 2 doses and became constipated. He did not like the way it made him feel any stopped it. General he's been pretty stable and is able to function on his current regimen. He denies any bleeding. He has occasional abdominal cramps but nothing significant. He tries to avoid some foods that trigger his symptoms, generally does okay.   Otherwise in regards to his fatty liver, recent labs showed ALT at 68, AST 26, AP 67, t bil 0.2 - last year about the same.  He reports his weight is roughly stable - has been around 250 lbs for a few years. He does not drink alcohol. He does routinely drink coffee daily.   Colonoscopy - 04/24/16 - normal ileum, normal colon other than 2 small polyps - hyperplastic, small hemorhoids, biopsies taken - no microscopic colitis   Past Medical History:  Diagnosis Date  . Allergy   . Anxiety    alpraz started about 2011  . Fatty liver    u/s 04/2017  . IBS (irritable bowel syndrome)   . Insomnia   . Seasonal allergic rhinitis      Past Surgical History:  Procedure Laterality Date  . COLONOSCOPY W/ POLYPECTOMY  04/24/2016   polyps x 2= hyperplastic.  Random colon bx's NEG.   Recall 11 yrs (2028, age 32).   Family History  Problem Relation Age of Onset  . Arthritis Father   . Colon cancer Neg Hx   . Prostate cancer Neg Hx    Social History   Tobacco Use  . Smoking status: Never Smoker  . Smokeless tobacco: Never Used  Substance Use Topics  . Alcohol use: No    Alcohol/week: 0.0 standard drinks    Comment: OCC  . Drug use: No   Current Outpatient Medications  Medication Sig Dispense Refill  . ALPRAZolam (XANAX) 1 MG tablet TAKE 1 TO 2 TABLETS BY MOUTH ONCE DAILY AS NEEDED FOR ANXIETY 180 tablet 1  . colestipol (COLESTID) 1 g tablet TAKE 1 TABLET BY MOUTH TWICE DAILY AS NEEDED FOR  IBS 60 tablet 3   No current facility-administered medications for this visit.    No Known Allergies   Review of Systems: All systems reviewed and negative except where noted in HPI.   Lab Results  Component Value Date   WBC 7.7 05/11/2015   HGB 16.3 05/11/2015   HCT 48.2 05/11/2015   MCV 90.7 05/11/2015   PLT 267.0 05/11/2015    Lab Results  Component Value Date   CREATININE 0.98 05/26/2015   BUN 17 05/26/2015   NA 139 05/26/2015   K 4.3 05/26/2015  CL 104 05/26/2015   CO2 28 05/26/2015    Lab Results  Component Value Date   ALT 68 (H) 05/22/2018   AST 26 05/22/2018   ALKPHOS 67 05/22/2018   BILITOT 1.0 05/22/2018     Physical Exam: BP 110/80 (BP Location: Left Arm, Patient Position: Sitting, Cuff Size: Normal)   Pulse 68   Ht 6' (1.829 m)   Wt 254 lb 4 oz (115.3 kg)   BMI 34.48 kg/m  Constitutional: Pleasant,well-developed, male in no acute distress. HEENT: Normocephalic and atraumatic. Conjunctivae are normal. No scleral icterus. Neck supple.  Cardiovascular: Normal rate, regular rhythm.  Pulmonary/chest: Effort normal and breath sounds normal. No wheezing, rales or rhonchi. Abdominal: Soft, nondistended, nontender.There are no masses palpable. No hepatomegaly. Extremities: no edema Lymphadenopathy: No cervical adenopathy  noted. Neurological: Alert and oriented to person place and time. Skin: Skin is warm and dry. No rashes noted. Psychiatric: Normal mood and affect. Behavior is normal.   ASSESSMENT AND PLAN: 42 year old male here for reassessment following issues:  IBS D - generally doing pretty well on Colestid. I will recheck his lipids on this regimen to ensure stable triglycerides. If things are stable we can increase the Colestid to 2 g twice a day to see if this provides any additional benefit. Otherwise he can take some Imodium as needed as well to reduced stool frequency. Rifaximin was not well covered by his insurance when tried a few years ago. Viberzi cause side effects and he did not tolerate it. We'll let him know results of labs, until that time we'll try the Imodium as needed.  Fatty liver - discussed the spectrum of fatty liver disease with him, risks for the development of Karlene Lineman and cirrhosis. He does not drink alcohol much at all, recommend he continue to abstain. We discussed the importance of weight loss over time, he will work on this. LFTs stable, would check yearly. Will check Hgb A1c today. Follow up yearly for this issue.  Dalton Cellar, MD [facility]

## 2018-07-03 NOTE — Patient Instructions (Signed)
If you are age 41 or older, your body mass index should be between 23-30. Your Body mass index is 34.48 kg/m. If this is out of the aforementioned range listed, please consider follow up with your Primary Care Provider.  If you are age 78 or younger, your body mass index should be between 19-25. Your Body mass index is 34.48 kg/m. If this is out of the aformentioned range listed, please consider follow up with your Primary Care Provider.   Please go to the lab in the basement of our building to have lab work done as you leave today. Hit "B" for basement when you get on the elevator.  When the doors open the lab is on your left.  We will call you with the results. Thank you.  Thank you for entrusting me with your care and for choosing [facility], Dr.  Cellar

## 2018-07-16 ENCOUNTER — Encounter: Payer: Self-pay | Admitting: Family Medicine

## 2018-08-15 ENCOUNTER — Other Ambulatory Visit (INDEPENDENT_AMBULATORY_CARE_PROVIDER_SITE_OTHER): Payer: 59

## 2018-08-15 DIAGNOSIS — E785 Hyperlipidemia, unspecified: Secondary | ICD-10-CM

## 2018-08-15 LAB — LIPID PANEL
CHOLESTEROL: 205 mg/dL — AB (ref 0–200)
HDL: 38.5 mg/dL — ABNORMAL LOW (ref >39.00)
LDL Cholesterol: 132 mg/dL — ABNORMAL HIGH (ref 0–99)
NonHDL: 166.88
TRIGLYCERIDES: 172 mg/dL — AB (ref 0.0–149.0)
Total CHOL/HDL Ratio: 5
VLDL: 34.4 mg/dL (ref 0.0–40.0)

## 2018-08-19 ENCOUNTER — Telehealth: Payer: Self-pay

## 2018-08-19 MED ORDER — AMITRIPTYLINE HCL 10 MG PO TABS: ORAL_TABLET | ORAL | 1 refills | Status: DC

## 2018-08-19 NOTE — Telephone Encounter (Signed)
close

## 2018-08-19 NOTE — Progress Notes (Signed)
Error. See phone note

## 2018-10-20 ENCOUNTER — Other Ambulatory Visit: Payer: Self-pay | Admitting: Gastroenterology

## 2018-10-21 NOTE — Telephone Encounter (Signed)
Dr. Havery Moros, patient requesting a refill of Elavil.  Last seen 06-2018. Please advise

## 2018-10-21 NOTE — Telephone Encounter (Signed)
Jan can you ask if it is helping him and how much he is taking. If helping we can refill. Thanks

## 2018-10-22 NOTE — Telephone Encounter (Signed)
Pt indicated that he is taking two at bedtime and it is helping, not as much as Colestid but it is helping.

## 2018-10-27 ENCOUNTER — Other Ambulatory Visit: Payer: Self-pay

## 2018-10-27 ENCOUNTER — Telehealth: Payer: Self-pay | Admitting: Gastroenterology

## 2018-10-27 MED ORDER — AMITRIPTYLINE HCL 10 MG PO TABS: 20.0000 mg | ORAL_TABLET | Freq: Every day | ORAL | 3 refills | Status: DC

## 2018-10-27 NOTE — Progress Notes (Signed)
[facility] said they did not receive script that was sent on 3-25. Script resent

## 2018-10-27 NOTE — Telephone Encounter (Signed)
Pt called and stated that his prescription was not at the pharm.

## 2018-10-27 NOTE — Telephone Encounter (Signed)
Called [facility] where script was sent on 3-25.  They have not rec'd the script.  Resent to [employer].

## 2018-10-30 ENCOUNTER — Other Ambulatory Visit: Payer: Self-pay | Admitting: Family Medicine

## 2018-10-30 NOTE — Telephone Encounter (Signed)
FYI Pt advised, will do telephone visit and come in to sign contract, UDS Monday 11/03/18

## 2018-10-30 NOTE — Telephone Encounter (Signed)
I'll do eRx for # 20 alpraz tabs. Needs virtual or phone f/u before any further alpraz after this. He'll need to come by at some point soon to sign an updated controlled substance contract and give urine for UDS. -thx

## 2018-10-30 NOTE — Telephone Encounter (Signed)
RF request for Alprazolam 1mg  tab LOV: 02/17/18 Next ov: none Last written: 02/17/18 #180 w/ 1RF.  Last CSC and UDS on file from 06/03/17.  Please advise, thanks.

## 2018-11-03 ENCOUNTER — Other Ambulatory Visit: Payer: Self-pay

## 2018-11-03 ENCOUNTER — Ambulatory Visit (INDEPENDENT_AMBULATORY_CARE_PROVIDER_SITE_OTHER): Payer: 59 | Admitting: Family Medicine

## 2018-11-03 ENCOUNTER — Other Ambulatory Visit: Payer: 59

## 2018-11-03 ENCOUNTER — Encounter: Payer: Self-pay | Admitting: Family Medicine

## 2018-11-03 DIAGNOSIS — F418 Other specified anxiety disorders: Secondary | ICD-10-CM

## 2018-11-03 DIAGNOSIS — Z79899 Other long term (current) drug therapy: Secondary | ICD-10-CM

## 2018-11-03 DIAGNOSIS — F411 Generalized anxiety disorder: Secondary | ICD-10-CM | POA: Diagnosis not present

## 2018-11-03 MED ORDER — ALPRAZOLAM 1 MG PO TABS: ORAL_TABLET | ORAL | 5 refills | Status: DC

## 2018-11-03 NOTE — Progress Notes (Signed)
Patient presented to office today for UDS.  Last Alprazolam was taken 11/02/2018 at around 9pm.

## 2018-11-03 NOTE — Progress Notes (Signed)
OFFICE VISIT  11/03/2018   CC:  Chief Complaint  Patient presents with  . Follow-up    anxiety   HPI:    Patient is a 43 y.o. Caucasian male With whom I am doing a telephone visit today (due to COVID-19 pandemic restrictions). Prior to proceeding with our visit today, the telephone visit process was explained  and pt gave consent to proceed and understood that this process would be used to assess and treat the patient.  Interim hx:  Patient is a 43 y.o.  male who presents for f/u GAD and situational anxiety. Anxiety has been well controlled on xanax bid in the past. CSC in place, most recent UDS 06/03/17 showed appropriate results.  Pt doing well, takes xanax 1mg  bid consistently. It helps his generlized anxiety good overall, deals with stress better, less anticipatory anxiety, less tendency to get near-panic in certain stressful situations. No adverse side effects from med.   Past Medical History:  Diagnosis Date  . Allergy   . Anxiety    alpraz started about 2011  . Fatty liver    u/s 04/2017  . IBS (irritable bowel syndrome)    -D.  Cholestyramine helpful.  . Insomnia   . Seasonal allergic rhinitis     Past Surgical History:  Procedure Laterality Date  . COLONOSCOPY W/ POLYPECTOMY  04/24/2016   polyps x 2= hyperplastic.  Random colon bx's NEG.  Recall 11 yrs (2028, age 26).    Outpatient Medications Prior to Visit  Medication Sig Dispense Refill  . amitriptyline (ELAVIL) 10 MG tablet Take 2 tablets (20 mg total) by mouth at bedtime. 60 tablet 3  . ALPRAZolam (XANAX) 1 MG tablet TAKE 1 TO 2 TABLETS BY MOUTH ONCE DAILY AS NEEDED FOR ANXIETY 20 tablet 0   No facility-administered medications prior to visit.     No Known Allergies  ROS As per HPI  PE: There were no vitals taken for this visit. Gen: Alert, NAD by voice/interaction on phone.  Patient is oriented to person, place, time, and situation. Lucid thought and speech.   LABS:    Chemistry       Component Value Date/Time   NA 139 05/26/2015 1045   K 4.3 05/26/2015 1045   CL 104 05/26/2015 1045   CO2 28 05/26/2015 1045   BUN 17 05/26/2015 1045   CREATININE 0.98 05/26/2015 1045      Component Value Date/Time   CALCIUM 9.9 05/26/2015 1045   ALKPHOS 67 05/22/2018 1000   AST 26 05/22/2018 1000   ALT 68 (H) 05/22/2018 1000   BILITOT 1.0 05/22/2018 1000     Lab Results  Component Value Date   CHOL 205 (H) 08/15/2018   HDL 38.50 (L) 08/15/2018   LDLCALC 132 (H) 08/15/2018   LDLDIRECT 111.0 07/03/2018   TRIG 172.0 (H) 08/15/2018   CHOLHDL 5 08/15/2018   Lab Results  Component Value Date   HGBA1C 5.1 07/03/2018    IMPRESSION AND PLAN:  GAD with situational anxiety. The current medical regimen is effective;  continue present plan and medications.  Update CSC and obtain UDS- he'll come by the office ASAP and sign updated contract and give urine sample.   FOLLOW UP: Return in about 6 months (around 05/05/2019) for annual CPE (fasting).  Signed:  Crissie Sickles, MD           11/03/2018

## 2018-11-05 LAB — PAIN MGMT, PROF 8 CONF W/O MM, U
6 Acetylmorphine: NEGATIVE ng/mL (ref <10)
Alcohol Metabolites: NEGATIVE ng/mL (ref <500)
Alphahydroxyalprazolam: 199 ng/mL — ABNORMAL HIGH (ref <25)
Alphahydroxymidazolam: NEGATIVE ng/mL (ref <50)
Alphahydroxytriazolam: NEGATIVE ng/mL (ref <50)
Aminoclonazepam: NEGATIVE ng/mL (ref <25)
Amphetamines: NEGATIVE ng/mL (ref <500)
Benzodiazepines: POSITIVE ng/mL — AB (ref <100)
Buprenorphine, Urine: NEGATIVE ng/mL (ref <5)
Cocaine Metabolite: NEGATIVE ng/mL (ref <150)
Creatinine: 99.5 mg/dL
Hydroxyethylflurazepam: NEGATIVE ng/mL (ref <50)
Lorazepam: NEGATIVE ng/mL (ref <50)
MDMA: NEGATIVE ng/mL (ref <500)
Marijuana Metabolite: NEGATIVE ng/mL (ref <20)
Nordiazepam: NEGATIVE ng/mL (ref <50)
Opiates: NEGATIVE ng/mL (ref <100)
Oxazepam: NEGATIVE ng/mL (ref <50)
Oxidant: NEGATIVE ug/mL (ref <200)
Oxycodone: NEGATIVE ng/mL (ref <100)
Temazepam: NEGATIVE ng/mL (ref <50)
pH: 5.59 (ref 4.5–9.0)

## 2018-11-24 ENCOUNTER — Other Ambulatory Visit: Payer: Self-pay | Admitting: Family Medicine

## 2018-11-26 ENCOUNTER — Other Ambulatory Visit: Payer: Self-pay | Admitting: Family Medicine

## 2018-11-30 IMAGING — CR DG ANKLE COMPLETE 3+V*R*
3 series · 3 of 3 positions shown · non-contrast
Comparison: No recent .

CLINICAL DATA: Laceration 1 week ago.  Swelling and pain .

EXAM:
RIGHT ANKLE - COMPLETE 3+ VIEW

[ankle ap]
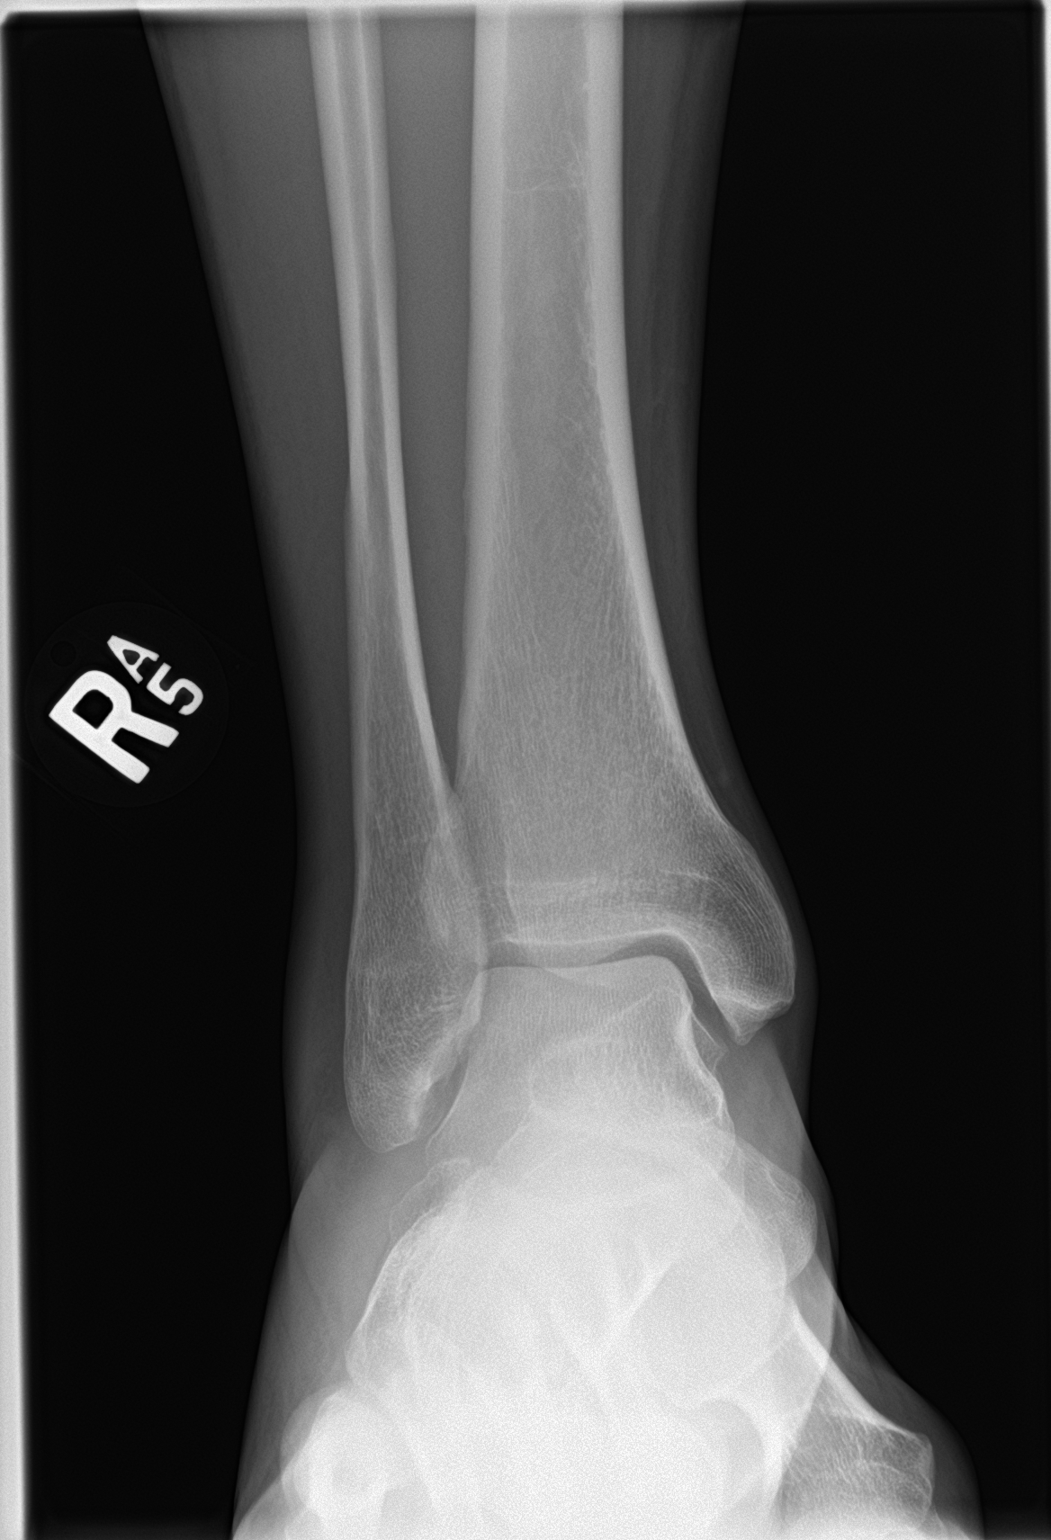

[ankle obl]
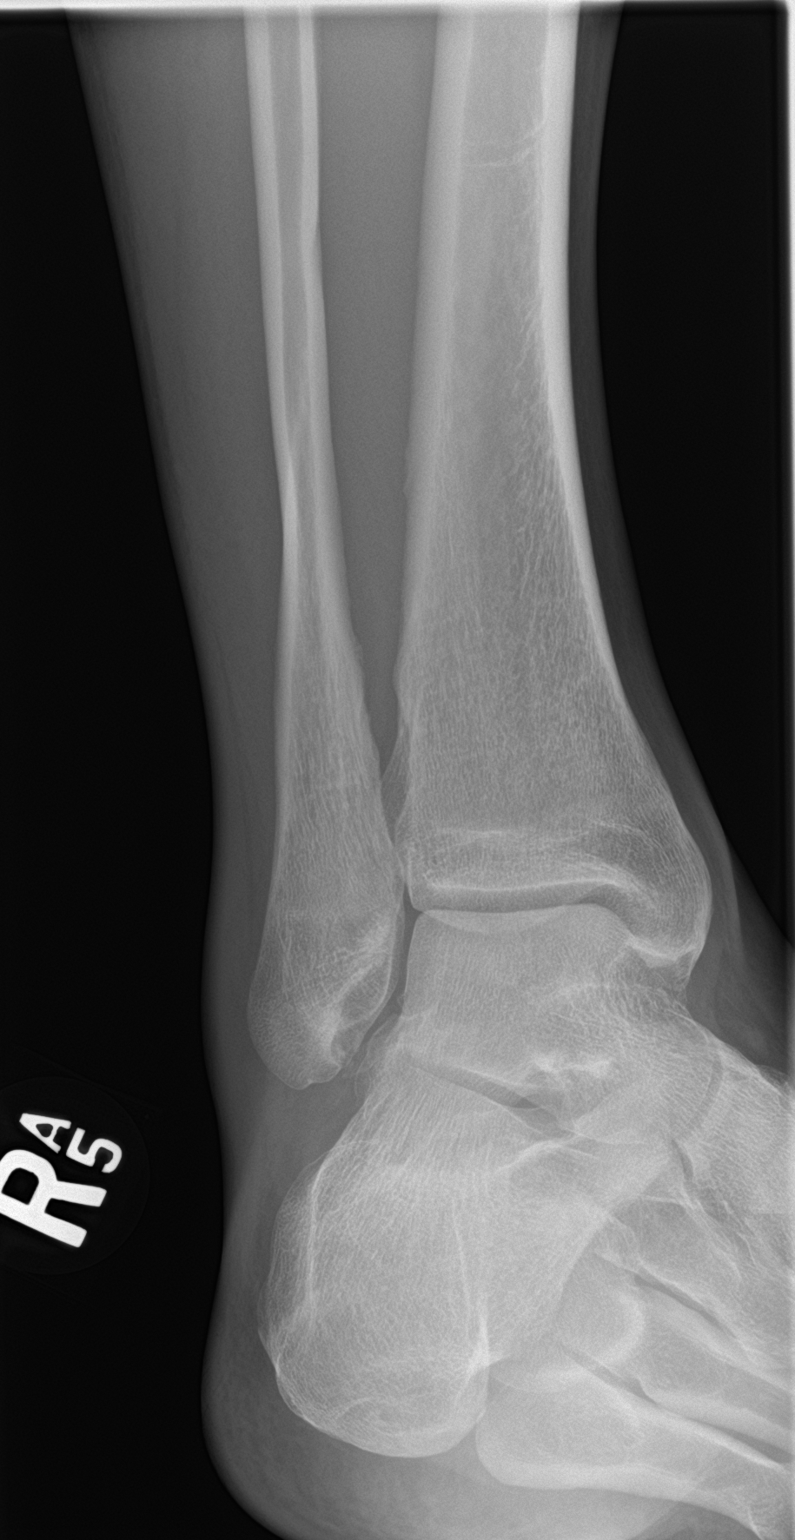

[ankle lat]
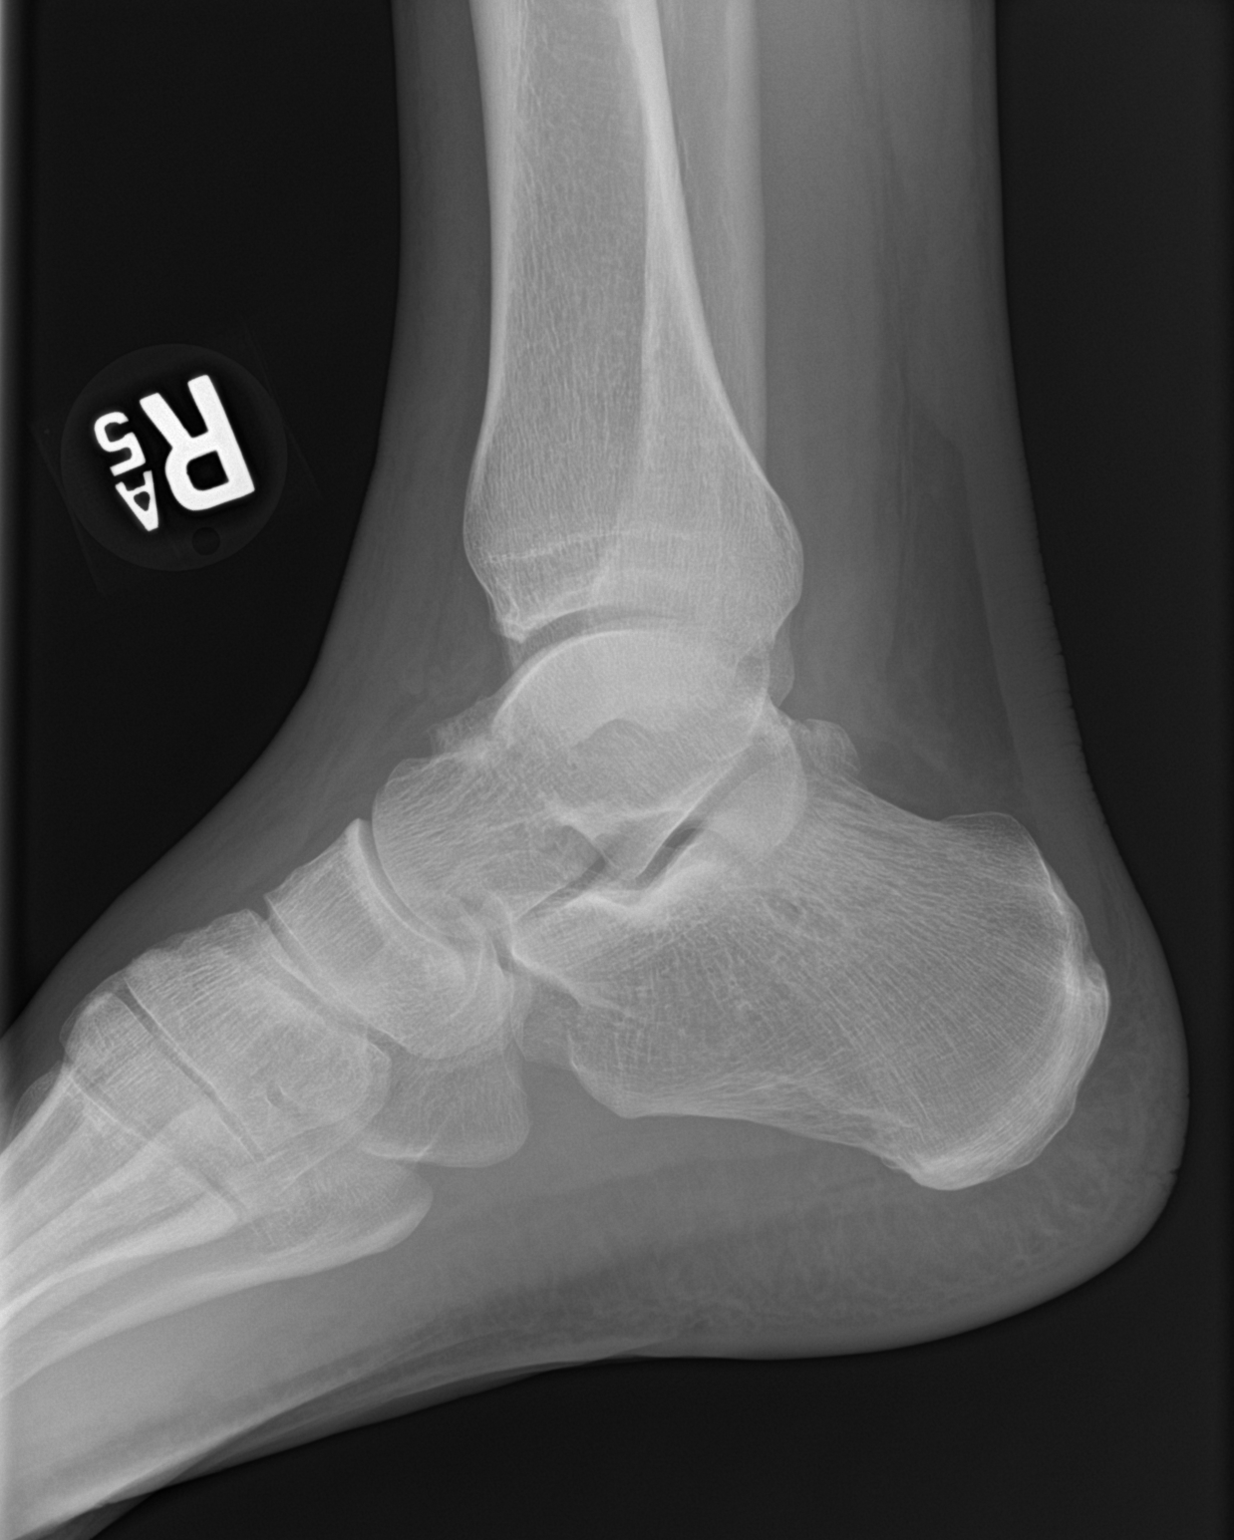

[3 of 3 positions shown; findings below may reference images not displayed]

FINDINGS: Soft tissue swelling. No evidence of fracture dislocation. No acute
bony abnormality.
IMPRESSION: Soft tissue swelling.  No acute or focal bony abnormality.

## 2019-02-20 ENCOUNTER — Other Ambulatory Visit: Payer: Self-pay | Admitting: Gastroenterology

## 2019-03-24 ENCOUNTER — Other Ambulatory Visit: Payer: Self-pay | Admitting: Gastroenterology

## 2019-03-25 ENCOUNTER — Other Ambulatory Visit: Payer: Self-pay

## 2019-04-25 ENCOUNTER — Other Ambulatory Visit: Payer: Self-pay | Admitting: Gastroenterology

## 2019-04-27 NOTE — Telephone Encounter (Signed)
If he is taking it and it is helping him, yes can refill. Thanks

## 2019-04-27 NOTE — Telephone Encounter (Signed)
Ok to refill amitriptyline? Last since 07-03-2018. Thank you

## 2019-05-04 ENCOUNTER — Encounter: Payer: Self-pay | Admitting: Family Medicine

## 2019-05-04 ENCOUNTER — Ambulatory Visit (INDEPENDENT_AMBULATORY_CARE_PROVIDER_SITE_OTHER): Payer: 59 | Admitting: Family Medicine

## 2019-05-04 ENCOUNTER — Other Ambulatory Visit: Payer: Self-pay

## 2019-05-04 VITALS — BP 123/84 | HR 82 | Temp 98.6°F | Resp 16 | Ht 72.0 in | Wt 255.4 lb

## 2019-05-04 DIAGNOSIS — F411 Generalized anxiety disorder: Secondary | ICD-10-CM | POA: Diagnosis not present

## 2019-05-04 DIAGNOSIS — Z0000 Encounter for general adult medical examination without abnormal findings: Secondary | ICD-10-CM

## 2019-05-04 DIAGNOSIS — E669 Obesity, unspecified: Secondary | ICD-10-CM | POA: Insufficient documentation

## 2019-05-04 LAB — CBC WITH DIFFERENTIAL/PLATELET
Basophils Absolute: 0.1 10*3/uL (ref 0.0–0.1)
Basophils Relative: 1 % (ref 0.0–3.0)
Eosinophils Absolute: 0.1 10*3/uL (ref 0.0–0.7)
Eosinophils Relative: 2.1 % (ref 0.0–5.0)
HCT: 50 % (ref 39.0–52.0)
Hemoglobin: 16.7 g/dL (ref 13.0–17.0)
Lymphocytes Relative: 32 % (ref 12.0–46.0)
Lymphs Abs: 2 10*3/uL (ref 0.7–4.0)
MCHC: 33.4 g/dL (ref 30.0–36.0)
MCV: 91.8 fl (ref 78.0–100.0)
Monocytes Absolute: 0.5 10*3/uL (ref 0.1–1.0)
Monocytes Relative: 7.8 % (ref 3.0–12.0)
Neutro Abs: 3.5 10*3/uL (ref 1.4–7.7)
Neutrophils Relative %: 57.1 % (ref 43.0–77.0)
Platelets: 248 10*3/uL (ref 150.0–400.0)
RBC: 5.45 Mil/uL (ref 4.22–5.81)
RDW: 12.9 % (ref 11.5–15.5)
WBC: 6.1 10*3/uL (ref 4.0–10.5)

## 2019-05-04 LAB — LIPID PANEL
Cholesterol: 181 mg/dL (ref 0–200)
HDL: 32.1 mg/dL — ABNORMAL LOW (ref >39.00)
LDL Cholesterol: 115 mg/dL — ABNORMAL HIGH (ref 0–99)
NonHDL: 148.54
Total CHOL/HDL Ratio: 6
Triglycerides: 167 mg/dL — ABNORMAL HIGH (ref 0.0–149.0)
VLDL: 33.4 mg/dL (ref 0.0–40.0)

## 2019-05-04 LAB — COMPREHENSIVE METABOLIC PANEL
ALT: 70 U/L — ABNORMAL HIGH (ref 0–53)
AST: 28 U/L (ref 0–37)
Albumin: 4.5 g/dL (ref 3.5–5.2)
Alkaline Phosphatase: 80 U/L (ref 39–117)
BUN: 15 mg/dL (ref 6–23)
CO2: 28 mEq/L (ref 19–32)
Calcium: 9.9 mg/dL (ref 8.4–10.5)
Chloride: 104 mEq/L (ref 96–112)
Creatinine, Ser: 1.06 mg/dL (ref 0.40–1.50)
GFR: 76.59 mL/min (ref >60.00)
Glucose, Bld: 95 mg/dL (ref 70–99)
Potassium: 4.2 mEq/L (ref 3.5–5.1)
Sodium: 140 mEq/L (ref 135–145)
Total Bilirubin: 0.6 mg/dL (ref 0.2–1.2)
Total Protein: 6.8 g/dL (ref 6.0–8.3)

## 2019-05-04 LAB — TSH: TSH: 3.45 u[IU]/mL (ref 0.35–4.50)

## 2019-05-04 NOTE — Progress Notes (Signed)
Office Note 05/04/2019  CC:  Chief Complaint  Patient presents with  . Annual Exam    pt is fasting    HPI:  George Lam is a 43 y.o. White male who is here for annual health maintenance exam and f/u use of xanax for situational anxiety.  Still taking alpraz every day and every evening and it is helping significantly. Very busy: working long hours, son in travel football, remodeling a home.  No time for exercise these days.   Finds it difficult to eat healthy as well.  Past Medical History:  Diagnosis Date  . Allergy   . Anxiety    alpraz started about 2011  . Fatty liver    u/s 04/2017  . IBS (irritable bowel syndrome)    -D.  Cholestyramine helpful.  . Insomnia   . Obesity, Class I, BMI 30-34.9   . Seasonal allergic rhinitis     Past Surgical History:  Procedure Laterality Date  . COLONOSCOPY W/ POLYPECTOMY  04/24/2016   polyps x 2= hyperplastic.  Random colon bx's NEG.  Recall 11 yrs (2028, age 28).    Family History  Problem Relation Age of Onset  . Arthritis Father   . Colon cancer Neg Hx   . Prostate cancer Neg Hx     Social History   Socioeconomic History  . Marital status: Married    Spouse name: Not on file  . Number of children: 2  . Years of education: Not on file  . Highest education level: Not on file  Occupational History  . Not on file  Social Needs  . Financial resource strain: Not on file  . Food insecurity    Worry: Not on file    Inability: Not on file  . Transportation needs    Medical: Not on file    Non-medical: Not on file  Tobacco Use  . Smoking status: Never Smoker  . Smokeless tobacco: Never Used  Substance and Sexual Activity  . Alcohol use: No    Alcohol/week: 0.0 standard drinks    Comment: OCC  . Drug use: No  . Sexual activity: Not on file  Lifestyle  . Physical activity    Days per week: Not on file    Minutes per session: Not on file  . Stress: Not on file  Relationships  . Social Clinical research associate on phone: Not on file    Gets together: Not on file    Attends religious service: Not on file    Active member of club or organization: Not on file    Attends meetings of clubs or organizations: Not on file    Relationship status: Not on file  . Intimate partner violence    Fear of current or ex partner: Not on file    Emotionally abused: Not on file    Physically abused: Not on file    Forced sexual activity: Not on file  Other Topics Concern  . Not on file  Social History Narrative   Married, 2 sons, lives in [place].   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   College: [employer], where he is originally from.   No T/A/Ds.    Outpatient Medications Prior to Visit  Medication Sig Dispense Refill  . amitriptyline (ELAVIL) 10 MG tablet TAKE 2 TABLETS BY MOUTH AT BEDTIME 60 tablet 2  . ALPRAZolam (XANAX) 1 MG tablet 1-2 tabs po bid prn anxiety 60 tablet 5   No facility-administered medications prior to visit.  No Known Allergies  ROS Review of Systems  Constitutional: Negative for appetite change, chills, fatigue and fever.  HENT: Negative for congestion, dental problem, ear pain and sore throat.   Eyes: Negative for discharge, redness and visual disturbance.  Respiratory: Negative for cough, chest tightness, shortness of breath and wheezing.   Cardiovascular: Negative for chest pain, palpitations and leg swelling.  Gastrointestinal: Negative for abdominal pain, blood in stool, diarrhea, nausea and vomiting.  Genitourinary: Negative for difficulty urinating, dysuria, flank pain, frequency, hematuria and urgency.  Musculoskeletal: Negative for arthralgias, back pain, joint swelling, myalgias and neck stiffness.  Skin: Negative for pallor and rash.  Neurological: Negative for dizziness, speech difficulty, weakness and headaches.  Hematological: Negative for adenopathy. Does not bruise/bleed easily.  Psychiatric/Behavioral: Negative for confusion and  sleep disturbance. The patient is not nervous/anxious.     PE; Blood pressure 123/84, pulse 82, temperature 98.6 F (37 C), temperature source Temporal, resp. rate 16, height 6' (1.829 m), weight 255 lb 6.4 oz (115.8 kg), SpO2 98 %. Body mass index is 34.64 kg/m.  Gen: Alert, well appearing.  Patient is oriented to person, place, time, and situation. AFFECT: pleasant, lucid thought and speech. ENT: Ears: EACs clear, normal epithelium.  TMs with good light reflex and landmarks bilaterally.  Eyes: no injection, icteris, swelling, or exudate.  EOMI, PERRLA. Nose: no drainage or turbinate edema/swelling.  No injection or focal lesion.  Mouth: lips without lesion/swelling.  Oral mucosa pink and moist.  Dentition intact and without obvious caries or gingival swelling.  Oropharynx without erythema, exudate, or swelling.  Neck: supple/nontender.  No LAD, mass, or TM.  Carotid pulses 2+ bilaterally, without bruits. CV: RRR, no m/r/g.   LUNGS: CTA bilat, nonlabored resps, good aeration in all lung fields. ABD: soft, NT, ND, BS normal.  No hepatospenomegaly or mass.  No bruits. EXT: no clubbing, cyanosis, or edema.  Musculoskeletal: no joint swelling, erythema, warmth, or tenderness.  ROM of all joints intact. Skin - no sores or suspicious lesions or rashes or color changes   Pertinent labs:  Lab Results  Component Value Date   TSH 3.19 05/11/2015   Lab Results  Component Value Date   WBC 7.7 05/11/2015   HGB 16.3 05/11/2015   HCT 48.2 05/11/2015   MCV 90.7 05/11/2015   PLT 267.0 05/11/2015   Lab Results  Component Value Date   CREATININE 0.98 05/26/2015   BUN 17 05/26/2015   NA 139 05/26/2015   K 4.3 05/26/2015   CL 104 05/26/2015   CO2 28 05/26/2015   Lab Results  Component Value Date   ALT 68 (H) 05/22/2018   AST 26 05/22/2018   ALKPHOS 67 05/22/2018   BILITOT 1.0 05/22/2018   Lab Results  Component Value Date   CHOL 205 (H) 08/15/2018   Lab Results  Component Value  Date   HDL 38.50 (L) 08/15/2018   Lab Results  Component Value Date   LDLCALC 132 (H) 08/15/2018   Lab Results  Component Value Date   TRIG 172.0 (H) 08/15/2018   Lab Results  Component Value Date   CHOLHDL 5 08/15/2018   Lab Results  Component Value Date   HGBA1C 5.1 07/03/2018    ASSESSMENT AND PLAN:   Health maintenance exam: Reviewed age and gender appropriate health maintenance issues (prudent diet, regular exercise, health risks of tobacco and excessive alcohol, use of seatbelts, fire alarms in home, use of sunscreen).  Also reviewed age and gender appropriate health screening as well  as vaccine recommendations. Vaccines: pt declined flu vaccine today b/c he gets this via his employer. Labs: fasting HP today. Prostate ca screening: average risk patient= as per latest guidelines, start screening at 61 yrs of age. Colon ca screening: colonoscopy due at age 77 yrs.  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  Anxiety: doing well on bid xanax.  CSC and UDS UTD.   F/u this prob in 6 mo.  An After Visit Summary was printed and given to the patient.  FOLLOW UP:  Return in about 6 months (around 11/02/2019) for routine chronic illness f/u.  Signed:  Crissie Sickles, MD           05/04/2019

## 2019-05-04 NOTE — Patient Instructions (Signed)

## 2019-06-19 ENCOUNTER — Other Ambulatory Visit: Payer: Self-pay | Admitting: Family Medicine

## 2019-06-19 NOTE — Telephone Encounter (Signed)
Requesting: xanax Contract: 10/31/2018 UDS:  Last Visit: 05/04/2019 Next Visit: not scheduled Last Refill: 11/03/2018  Please Advise

## 2019-06-30 ENCOUNTER — Other Ambulatory Visit: Payer: Self-pay

## 2019-06-30 DIAGNOSIS — Z20822 Contact with and (suspected) exposure to covid-19: Secondary | ICD-10-CM

## 2019-07-02 ENCOUNTER — Emergency Department (HOSPITAL_COMMUNITY)
Admission: EM | Admit: 2019-07-02 | Discharge: 2019-07-02 | Disposition: A | Discharge status: Home / Self Care | Payer: 59 | Attending: Emergency Medicine | Admitting: Emergency Medicine

## 2019-07-02 ENCOUNTER — Emergency Department (HOSPITAL_COMMUNITY): Payer: 59

## 2019-07-02 ENCOUNTER — Encounter (HOSPITAL_COMMUNITY): Payer: Self-pay | Admitting: Emergency Medicine

## 2019-07-02 ENCOUNTER — Other Ambulatory Visit: Payer: Self-pay

## 2019-07-02 DIAGNOSIS — Z79899 Other long term (current) drug therapy: Secondary | ICD-10-CM | POA: Diagnosis not present

## 2019-07-02 DIAGNOSIS — R0602 Shortness of breath: Secondary | ICD-10-CM | POA: Diagnosis present

## 2019-07-02 DIAGNOSIS — U071 COVID-19: Secondary | ICD-10-CM | POA: Insufficient documentation

## 2019-07-02 LAB — COMPREHENSIVE METABOLIC PANEL
ALT: 143 U/L — ABNORMAL HIGH (ref 0–44)
AST: 86 U/L — ABNORMAL HIGH (ref 15–41)
Albumin: 4.2 g/dL (ref 3.5–5.0)
Alkaline Phosphatase: 67 U/L (ref 38–126)
Anion gap: 11 (ref 5–15)
BUN: 12 mg/dL (ref 6–20)
CO2: 21 mmol/L — ABNORMAL LOW (ref 22–32)
Calcium: 9 mg/dL (ref 8.9–10.3)
Chloride: 104 mmol/L (ref 98–111)
Creatinine, Ser: 1.21 mg/dL (ref 0.61–1.24)
GFR calc Af Amer: >60 mL/min (ref >60)
GFR calc non Af Amer: >60 mL/min (ref >60)
Glucose, Bld: 116 mg/dL — ABNORMAL HIGH (ref 70–99)
Potassium: 3.9 mmol/L (ref 3.5–5.1)
Sodium: 136 mmol/L (ref 135–145)
Total Bilirubin: 0.6 mg/dL (ref 0.3–1.2)
Total Protein: 7 g/dL (ref 6.5–8.1)

## 2019-07-02 LAB — CBC
HCT: 49.5 % (ref 39.0–52.0)
Hemoglobin: 17.4 g/dL — ABNORMAL HIGH (ref 13.0–17.0)
MCH: 30.8 pg (ref 26.0–34.0)
MCHC: 35.2 g/dL (ref 30.0–36.0)
MCV: 87.6 fL (ref 80.0–100.0)
Platelets: 159 10*3/uL (ref 150–400)
RBC: 5.65 MIL/uL (ref 4.22–5.81)
RDW: 12.6 % (ref 11.5–15.5)
WBC: 5.4 10*3/uL (ref 4.0–10.5)
nRBC: 0 % (ref 0.0–0.2)

## 2019-07-02 LAB — NOVEL CORONAVIRUS, NAA: SARS-CoV-2, NAA: DETECTED — AB

## 2019-07-02 LAB — TROPONIN I (HIGH SENSITIVITY): Troponin I (High Sensitivity): 6 ng/L (ref <18)

## 2019-07-02 LAB — LIPASE, BLOOD: Lipase: 33 U/L (ref 11–51)

## 2019-07-02 MED ORDER — IBUPROFEN 400 MG PO TABS
600.0000 mg | ORAL_TABLET | Freq: Once | ORAL | Status: AC
  Administered 2019-07-02: 600 mg via ORAL
  Filled 2019-07-02: qty 1

## 2019-07-02 MED ORDER — SODIUM CHLORIDE 0.9 % IV BOLUS
1000.0000 mL | Freq: Once | INTRAVENOUS | Status: AC
  Administered 2019-07-02: 1000 mL via INTRAVENOUS

## 2019-07-02 MED ORDER — ACETAMINOPHEN 500 MG PO TABS
1000.0000 mg | ORAL_TABLET | Freq: Once | ORAL | Status: AC
  Administered 2019-07-02: 1000 mg via ORAL
  Filled 2019-07-02: qty 2

## 2019-07-02 MED ORDER — SODIUM CHLORIDE 0.9% FLUSH
3.0000 mL | Freq: Once | INTRAVENOUS | Status: AC
  Administered 2019-07-02: 08:00:00 3 mL via INTRAVENOUS

## 2019-07-02 MED ORDER — IBUPROFEN 800 MG PO TABS: 800.0000 mg | ORAL_TABLET | Freq: Three times a day (TID) | ORAL | 0 refills | Status: DC | PRN

## 2019-07-02 NOTE — ED Notes (Signed)
Pt Spo2 is 97% on RA while ambulating, pt states that he is feeling better

## 2019-07-02 NOTE — ED Notes (Signed)
Pt states that he has had severe abdominal cramps since having this illness and reports that he has been drinking as much as he can but has only voided once a day for the last 5 days and has not had a BM for a week.  Pt is having chills, temp check shows he is febrile.  Pt took tylenol last 11pm yesterday, has not taken any ibuprofen.

## 2019-07-02 NOTE — ED Triage Notes (Addendum)
C/o lower back pain, generalized abd pain, SOB, pain to center of chest, fever, and chills x 4-5 days.  Pt states COVID tested 2 days ago- results pending.  Per Epic results are COVID +.

## 2019-07-02 NOTE — ED Provider Notes (Signed)
[facility] EMERGENCY DEPARTMENT Provider Note   CSN: [id number] Arrival date & time: 07/02/19  0711     History   Chief Complaint Chief Complaint  Patient presents with   Covid/SOB   Chest Pain   Back Pain   Abdominal Pain   Fever    HPI George Lam is a 43 y.o. male with no significant PMH who presents to the ED with complaints of shortness of breath, chest discomfort, fevers and chills, as well as generalized body aches.  Patient reports that he first noticed symptoms approximately 4 to 5 days ago and went to a clinic for COVID-19 testing.  He was unaware results until he came to the ED today and was informed that he had tested positive.  Patient reports that he has been using a pulse oximeter at home and his saturation has not dipped below 95%.  Presents to the ED today for evaluation given his progressively worsening shortness of breath symptoms.  He also reports intermittent episodes of chest discomfort lasting approximately 1 hour duration over the course of the past 3 to 4 days.  He denies any obvious triggers or position, but states that it is worse at night regardless of whether he is standing or lying down.  He also endorses crampy abdominal discomfort, but has history of IBS for which he reports taking colestipol.  He endorses diminished appetite and reports that he has not had a significant bowel movement since onset of symptoms.  He also endorses slight cough, but nondisruptive, as well as loss of taste beginning today.  He denies any nausea or vomiting, pleuritic chest discomfort, headache or dizziness, or neurologic symptoms.       HPI  Past Medical History:  Diagnosis Date   Allergy    Anxiety    alpraz started about 2011   Fatty liver    u/s 04/2017.  Very mild ALT elevations.   IBS (irritable bowel syndrome)    -D.  Cholestyramine helpful.   Insomnia    Obesity, Class I, BMI 30-34.9    Seasonal allergic rhinitis     Patient  Active Problem List   Diagnosis Date Noted   Obesity (BMI 30-39.9) 05/04/2019   Chronic diarrhea 04/11/2016    Past Surgical History:  Procedure Laterality Date   COLONOSCOPY W/ POLYPECTOMY  04/24/2016   polyps x 2= hyperplastic.  Random colon bx's NEG.  Recall 11 yrs (2028, age 55).        Home Medications    Prior to Admission medications   Medication Sig Start Date End Date Taking? Authorizing Provider  ALPRAZolam (XANAX) 1 MG tablet TAKE 1 TO 2 TABLETS BY MOUTH TWICE DAILY AS NEEDED FOR ANXIETY 06/20/19   McGowen, Adrian Blackwater, MD  amitriptyline (ELAVIL) 10 MG tablet TAKE 2 TABLETS BY MOUTH AT BEDTIME 04/28/19   Armbruster, Carlota Raspberry, MD  ibuprofen (ADVIL) 800 MG tablet Take 1 tablet (800 mg total) by mouth 3 (three) times daily as needed for fever. 07/02/19   Corena Herter, PA-C    Family History Family History  Problem Relation Age of Onset   Arthritis Father    Colon cancer Neg Hx    Prostate cancer Neg Hx     Social History Social History   Tobacco Use   Smoking status: Never Smoker   Smokeless tobacco: Never Used  Substance Use Topics   Alcohol use: No    Alcohol/week: 0.0 standard drinks    Comment: OCC   Drug  use: No     Allergies   Patient has no known allergies.   Review of Systems Review of Systems  All other systems reviewed and are negative.    Physical Exam Updated Vital Signs BP 115/67    Pulse (!) 103    Temp (!) 100.4 F (38 C) (Oral)    Resp 12    SpO2 95%   Physical Exam Vitals signs and nursing note reviewed. Exam conducted with a chaperone present.  Constitutional:      Appearance: Normal appearance.  HENT:     Head: Normocephalic and atraumatic.  Eyes:     General: No scleral icterus.    Conjunctiva/sclera: Conjunctivae normal.  Neck:     Musculoskeletal: Normal range of motion and neck supple.  Cardiovascular:     Rate and Rhythm: Normal rate and regular rhythm.     Pulses: Normal pulses.     Heart sounds:  Normal heart sounds.  Pulmonary:     Effort: Pulmonary effort is normal.     Comments: No acute distress.  Able to speak full sentences without increased respiratory effort.  No accessory muscle use. Abdominal:     Comments: Soft, nondistended.  No tenderness to palpation.  No guarding.  No rigidity.  No overlying skin changes.  Skin:    General: Skin is dry.  Neurological:     Mental Status: He is alert and oriented to person, place, and time.     GCS: GCS eye subscore is 4. GCS verbal subscore is 5. GCS motor subscore is 6.  Psychiatric:        Mood and Affect: Mood normal.        Behavior: Behavior normal.        Thought Content: Thought content normal.      ED Treatments / Results  Labs (all labs ordered are listed, but only abnormal results are displayed) Labs Reviewed  COMPREHENSIVE METABOLIC PANEL - Abnormal; Notable for the following components:      Result Value   CO2 21 (*)    Glucose, Bld 116 (*)    AST 86 (*)    ALT 143 (*)    All other components within normal limits  CBC - Abnormal; Notable for the following components:   Hemoglobin 17.4 (*)    All other components within normal limits  LIPASE, BLOOD  URINALYSIS, ROUTINE W REFLEX MICROSCOPIC  TROPONIN I (HIGH SENSITIVITY)    EKG EKG Interpretation  Date/Time:  Thursday July 02 2019 07:19:03 EST Ventricular Rate:  117 PR Interval:  124 QRS Duration: 80 QT Interval:  304 QTC Calculation: 424 R Axis:   118 Text Interpretation: Sinus tachycardia Left posterior fascicular block Abnormal ECG Confirmed by Carmin Muskrat [phone]) on 07/02/2019 8:11:30 AM   Radiology Dg Chest Portable 1 View  Result Date: 07/02/2019 CLINICAL DATA:  Chest pain and shortness of breath. COVID-19 positive. EXAM: PORTABLE CHEST 1 VIEW COMPARISON:  None. FINDINGS: The cardiac silhouette, mediastinal and hilar contours are normal. Patchy basilar infiltrates. No pleural effusion. No worrisome pulmonary lesions. The bony thorax is  intact. IMPRESSION: Patchy bibasilar infiltrates. Electronically Signed   By: Marijo Sanes M.D.   On: 07/02/2019 08:11    Procedures Procedures (including critical care time)  Medications Ordered in ED Medications  sodium chloride flush (NS) 0.9 % injection 3 mL (3 mLs Intravenous Given 07/02/19 0748)  ibuprofen (ADVIL) tablet 600 mg (600 mg Oral Given 07/02/19 0756)  sodium chloride 0.9 % bolus 1,000 mL (0  mLs Intravenous Stopped 07/02/19 0908)  acetaminophen (TYLENOL) tablet 1,000 mg (1,000 mg Oral Given 07/02/19 0805)     Initial Impression / Assessment and Plan / ED Course  I have reviewed the triage vital signs and the nursing notes.  Pertinent labs & imaging results that were available during my care of the patient were reviewed by me and considered in my medical decision making (see chart for details).       Novel coronavirus testing obtained 06/30/2019 is positive.  DG chest demonstrates patchy bibasilar infiltrates consistent with viral pneumonia.  No pneumothorax.  No pleuritic chest pain or other history concerning for embolism.  EKG demonstrated mild tachycardia with left fascicular block, but no concerning evidence of ischemia.  CBC demonstrates no leukopenia.  Lipase was normal.  Troponin was normal despite 3 to 4-day history of intermittent chest discomfort.  We will not continue to trend cardiac enzymes is a low suspicion for ACS.  CMP demonstrates mild transaminitis which is consistent with COVID-19 infection and no electrolyte abnormalities.  Patient denies any history of obstruction, abdominal surgeries, and is still able to flatulate.  Abdominal exam is benign and he denies any nausea or vomiting.  Low suspicion for obstruction and do not feel as though imaging warranted. Patient voiced understanding and is in agreement. Will try prune juice or stool softener and will hold off on his regular colestipol.   Emphasized the importance of oral hydration.  Encouraged continued  other appetite to avoid electrolyte abnormalities.  Continue Tylenol and ibuprofen as needed for fever and chills.  Continue monitoring oxygenation with pulse oximeter.  Consider over-the-counter measures for symptomatic relief of mild cough or other symptoms.  Also recommend over-the-counter stool softener or laxative to promote bowel movement.  Patient was able to ambulate on pulse oximetry and demonstrated saturation of 97% with no increased work of breathing.  Return to the ED or seek medical attention for worsening constipation symptoms, fevers or chills uncontrolled with antipyretics, inability to eat or drink, uncontrolled nausea or vomiting, increased respiratory effort or worsening shortness of breath symptoms, or any other new or worsening symptoms. All of the evaluation and work-up results were discussed with the patient and any family at bedside. They were provided opportunity to ask any additional questions and have none at this time. They have expressed understanding of verbal discharge instructions as well as return precautions and are agreeable to the plan.   Avalon Dulski was evaluated in Emergency Department on 07/02/2019 for the symptoms described in the history of present illness. He was evaluated in the context of the global COVID-19 pandemic, which necessitated consideration that the patient might be at risk for infection with the SARS-CoV-2 virus that causes COVID-19. Institutional protocols and algorithms that pertain to the evaluation of patients at risk for COVID-19 are in a state of rapid change based on information released by regulatory bodies including the CDC and federal and state organizations. These policies and algorithms were followed during the patient's care in the ED.   Final Clinical Impressions(s) / ED Diagnoses   Final diagnoses:  [id number]    ED Discharge Orders         Ordered    ibuprofen (ADVIL) 800 MG tablet  3 times daily PRN     07/02/19 0911            Corena Herter, PA-C 07/02/19 0915    Carmin Muskrat, MD 07/03/19 1014

## 2019-07-02 NOTE — ED Notes (Signed)
Pt is covid positive.  Pt was swabbed 2 days ago, he had not made mychart account to check results but they are visible on his chart.  Pt states that he has been sick with cough and sob which is worse at night.  Pt has had fever and chills with fever as high as 105 at the most.  Symptoms have been going on for 5 days.  Pt is able to speak in full sentences.  Pt appears to be feeling unwell.

## 2019-07-02 NOTE — Discharge Instructions (Addendum)
Continue ibuprofen or Tylenol as needed for fever and chills.  Recommending increased oral hydration.  You need to ensure that you are drinking plenty of water.  Continue over-the-counter measures for symptomatic relief of your mild cough and other symptoms.  Please consider prune juice or even a stool softener to promote bowel movement.  Continue to hold colestipol in interim.  Return to the ED or seek medical attention for any worsening abdominal pain or constipation, fevers or chills uncontrolled with antipyretics, inability to eat or drink, uncontrolled nausea or vomiting, increased respiratory effort or worsening shortness of breath symptoms, or any other new or worsening symptoms.  Otherwise, please continue to isolate at home.  Please read instructions below.  If you live with, or provide care at home for, a person confirmed to have, or being evaluated for, COVID-19 infection please follow these guidelines to prevent infection:  Follow healthcare providers instructions Make sure that you understand and can help the patient follow any healthcare provider instructions for all care.  Provide for the patients basic needs You should help the patient with basic needs in the home and provide support for getting groceries, prescriptions, and other personal needs.  Monitor the patients symptoms If they are getting sicker, call his or her medical provider a  This will help the healthcare providers office take steps to keep other people from getting infected. Ask the healthcare provider to call the local or state health department.  Limit the number of people who have contact with the patient If possible, have only one caregiver for the patient. Other household members should stay in another home or place of residence. If this is not possible, they should stay in another room, or be separated from the patient as much as possible. Use a separate bathroom, if available. Restrict visitors who  do not have an essential need to be in the home.  Keep older adults, very young children, and other sick people away from the patient Keep older adults, very young children, and those who have compromised immune systems or chronic health conditions away from the patient. This includes people with chronic heart, lung, or kidney conditions, diabetes, and cancer.  Ensure good ventilation Make sure that shared spaces in the home have good air flow, such as from an air conditioner or an opened window, weather permitting.  Wash your hands often Wash your hands often and thoroughly with soap and water for at least 20 seconds. You can use an alcohol based hand sanitizer if soap and water are not available and if your hands are not visibly dirty. Avoid touching your eyes, nose, and mouth with unwashed hands. Use disposable paper towels to dry your hands. If not available, use dedicated cloth towels and replace them when they become wet.  Wear a facemask and gloves Wear a disposable facemask at all times in the room and gloves when you touch or have contact with the patients blood, body fluids, and/or secretions or excretions, such as sweat, saliva, sputum, nasal mucus, vomit, urine, or feces.  Ensure the mask fits over your nose and mouth tightly, and do not touch it during use. Throw out disposable facemasks and gloves after using them. Do not reuse. Wash your hands immediately after removing your facemask and gloves. If your personal clothing becomes contaminated, carefully remove clothing and launder. Wash your hands after handling contaminated clothing. Place all used disposable facemasks, gloves, and other waste in a lined container before disposing them with other household waste. Remove  gloves and wash your hands immediately after handling these items.  Do not share dishes, glasses, or other household items with the patient Avoid sharing household items. You should not share dishes, drinking  glasses, cups, eating utensils, towels, bedding, or other items After the person uses these items, you should wash them thoroughly with soap and water.  Wash laundry thoroughly Immediately remove and wash clothes or bedding that have blood, body fluids, and/or secretions or excretions, such as sweat, saliva, sputum, nasal mucus, vomit, urine, or feces, on them. Wear gloves when handling laundry from the patient. Read and follow directions on labels of laundry or clothing items and detergent. In general, wash and dry with the warmest temperatures recommended on the label.  Clean all areas the individual has used often Clean all touchable surfaces, such as counters, tabletops, doorknobs, bathroom fixtures, toilets, phones, keyboards, tablets, and bedside tables, every day. Also, clean any surfaces that may have blood, body fluids, and/or secretions or excretions on them. Wear gloves when cleaning surfaces the patient has come in contact with. Use a diluted bleach solution (e.g., dilute bleach with 1 part bleach and 10 parts water) or a household disinfectant with a label that says EPA-registered for coronaviruses. To make a bleach solution at home, add 1 tablespoon of bleach to 1 quart (4 cups) of water. For a larger supply, add  cup of bleach to 1 gallon (16 cups) of water. Read labels of cleaning products and follow recommendations provided on product labels. Labels contain instructions for safe and effective use of the cleaning product including precautions you should take when applying the product, such as wearing gloves or eye protection and making sure you have good ventilation during use of the product. Remove gloves and wash hands immediately after cleaning.  Monitor yourself for signs and symptoms of illness Caregivers and household members are considered close contacts, should monitor their health, and will be asked to limit movement outside of the home to the extent possible. Follow the  monitoring steps for close contacts listed on the symptom monitoring form.   ? If you have additional questions, contact your local health department or call the epidemiologist on call at [phone] (available 24/7). ? This guidance is subject to change. For the most up-to-date guidance from [facility], please refer to their website: [email or web address]

## 2019-07-02 NOTE — ED Notes (Signed)
Gave pt urinal and advised that urine sample is needed

## 2019-08-02 ENCOUNTER — Other Ambulatory Visit: Payer: Self-pay | Admitting: Gastroenterology

## 2019-10-01 ENCOUNTER — Other Ambulatory Visit: Payer: Self-pay | Admitting: Gastroenterology

## 2019-10-05 ENCOUNTER — Other Ambulatory Visit: Payer: Self-pay | Admitting: Gastroenterology

## 2019-10-07 ENCOUNTER — Other Ambulatory Visit: Payer: Self-pay | Admitting: Gastroenterology

## 2019-10-07 NOTE — Telephone Encounter (Signed)
Pt requested another refill for amitriptyline.  He is scheduled for an OV 11/12/19.

## 2019-11-12 ENCOUNTER — Ambulatory Visit (INDEPENDENT_AMBULATORY_CARE_PROVIDER_SITE_OTHER): Payer: Commercial Managed Care - PPO | Admitting: Gastroenterology

## 2019-11-12 ENCOUNTER — Encounter: Payer: Self-pay | Admitting: Gastroenterology

## 2019-11-12 VITALS — BP 128/80 | HR 84 | Temp 98.3°F | Ht 72.0 in | Wt 263.0 lb

## 2019-11-12 DIAGNOSIS — K760 Fatty (change of) liver, not elsewhere classified: Secondary | ICD-10-CM | POA: Diagnosis not present

## 2019-11-12 DIAGNOSIS — K580 Irritable bowel syndrome with diarrhea: Secondary | ICD-10-CM | POA: Diagnosis not present

## 2019-11-12 MED ORDER — AMITRIPTYLINE HCL 50 MG PO TABS: 50.0000 mg | ORAL_TABLET | Freq: Every day | ORAL | 3 refills | Status: DC

## 2019-11-12 NOTE — Progress Notes (Signed)
HPI :  44 year old male here for follow-up visit for IBS-D and fatty liver disease. He has had a prior evaluation with negative celiac labs.  He has had a prior colonoscopy in 2017 which showed no evidence of microscopic colitis or IBD.  I treated him with Colestid in the past for his loose stools and this worked remarkably well and he was quite happy with the regimen.  Unfortunately when I checked his lipid panel in 2019 his triglycerides had elevated to the 400s.  We stopped the drug and the triglyceride levels came down nicely, following that result we decided to stop the Colestid.  I had recommended a trial of rifaximin in the past for him however it was not covered by insurance and could not afford it.  Trial of Viberzi was not well tolerated and we stopped it.  Since of last seen him I placed him on Elavil dose to 20 mg nightly.  He is tolerating the regimen.  His stool frequency has been improved on the regimen although not as good as it was on the Colestid.  He is functioning well though and happy with the regimen.  His bowels can fluctuate slightly and can be a little bit sporadic but in general his stool frequency is lower than it was before.  In light of the Covid pandemic he has been working from home and he endorses a lot less stress in that environment his bowels have not been as bothersome although again not as good as they were on the colestid.   Otherwise I have followed him for fatty liver in the past.  He had a negative serologic evaluation for other causes of ALT elevation.  He does not drink any alcohol of significance.  He thinks over the past 4 to 5 years his weights gone from 220s to 260s.  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  His most recent labs were in December 2020 at which time his ALT is 143, AST 86, alk phos 67, T bili normal.  Prior to that in October 2020 his ALT was 70.  Colonoscopy - 04/24/16 -  normal ileum, normal colon other than 2 small polyps - hyperplastic, small hemorhoids, biopsies taken - no microscopic colitis    US abdomen 05/29/17 - IMPRESSION: 1. Two very small gallbladder polyps which are unchanged since July, 2017. No further imaging follow-up is felt necessary. 2. Diffuse hepatic steatosis with focal sparing adjacent to the gallbladder. No significant focal hepatic parenchymal abnormalities.       Past Medical History:  Diagnosis Date  . Allergy   . Anxiety    alpraz started about 2011  . Fatty liver    u/s 04/2017.  Very mild ALT elevations.  . IBS (irritable bowel syndrome)    -D.  Cholestyramine helpful.  . Insomnia   . Obesity, Class I, BMI 30-34.9   . Seasonal allergic rhinitis      Past Surgical History:  Procedure Laterality Date  . COLONOSCOPY W/ POLYPECTOMY  04/24/2016   polyps x 2= hyperplastic.  Random colon bx's NEG.  Recall 11 yrs (2028, age 53).   Family History  Problem Relation Age of Onset  . Arthritis Father   . Colon cancer Neg Hx   . Prostate cancer Neg Hx   . Stomach cancer Neg Hx   . Pancreatic cancer Neg Hx   . Esophageal cancer Neg Hx  Social History   Tobacco Use  . Smoking status: Never Smoker  . Smokeless tobacco: Never Used  Substance Use Topics  . Alcohol use: Yes    Alcohol/week: 0.0 standard drinks    Comment: OCC  . Drug use: No   Current Outpatient Medications  Medication Sig Dispense Refill  . ALPRAZolam (XANAX) 1 MG tablet TAKE 1 TO 2 TABLETS BY MOUTH TWICE DAILY AS NEEDED FOR ANXIETY 60 tablet 5  . amitriptyline (ELAVIL) 10 MG tablet Take 2 tablets (20 mg total) by mouth at bedtime. 60 tablet 1  . ibuprofen (ADVIL) 800 MG tablet Take 1 tablet (800 mg total) by mouth 3 (three) times daily as needed for fever. 21 tablet 0   No current facility-administered medications for this visit.   No Known Allergies   Review of Systems: All systems reviewed and negative except where noted in HPI.     Lab Results  Component Value Date   ALT 143 (H) 07/02/2019   AST 86 (H) 07/02/2019   ALKPHOS 67 07/02/2019   BILITOT 0.6 07/02/2019    Lab Results  Component Value Date   CREATININE 1.21 07/02/2019   BUN 12 07/02/2019   NA 136 07/02/2019   K 3.9 07/02/2019   CL 104 07/02/2019   CO2 21 (L) 07/02/2019    Lab Results  Component Value Date   WBC 5.4 07/02/2019   HGB 17.4 (H) 07/02/2019   HCT 49.5 07/02/2019   MCV 87.6 07/02/2019   PLT 159 07/02/2019     Physical Exam: BP 128/80   Pulse 84   Temp 98.3 F (36.8 C)   Ht 6' (1.829 m)   Wt 263 lb (119.3 kg)   BMI 35.67 kg/m  Constitutional: Pleasant,well-developed, male in no acute distress. Skin: Skin is warm and dry. No rashes noted. Psychiatric: Normal mood and affect. Behavior is normal.   ASSESSMENT AND PLAN: 44 year old male here for reassessment of the following issues:  IBS-D - I feel this is the correct diagnosis following negative colonoscopy and lab evaluation.  Did well with Colestid but this raised his triglycerides so he had to stop it.  Could not afford trial of rifaximin, Viberzi, side effects.  He has been on Elavil 20 mg since of last seen him and this definitely has helped although not quite as much as the Colestid.  There is room to go with this dosing, discussed trying an increase to 50 mg nightly to see if this may provide some additional benefit.  I discussed that if he is drowsy at the higher dose than we can was back down if he does not tolerate it.  He was agreeable to this.  I will see him at least once yearly for this issue.  If he does not tolerate the medicine or has any issues asked him to call me.  He agreed  Fatty liver - ALT has intervally risen, his weight is higher than it has ever been.  I discussed long-term risks of Karlene Lineman and cirrhosis with this.  Recommend he adjust his lifestyle and start exercising as he can diet to lose weight normalizes body mass index if possible.  He was  agreeable to this, is not interested in assistance right now.  He will continue to work on this.  I will repeat his liver enzymes and is able to go to the lab month or 2.  He agreed  [place] Cellar, MD [facility]

## 2019-11-12 NOTE — Patient Instructions (Addendum)
If you are age 43 or older, your body mass index should be between 23-30. Your Body mass index is 35.67 kg/m. If this is out of the aforementioned range listed, please consider follow up with your Primary Care Provider.  If you are age 15 or younger, your body mass index should be between 19-25. Your Body mass index is 35.67 kg/m. If this is out of the aformentioned range listed, please consider follow up with your Primary Care Provider.   Please go to the lab in the basement of our building to have lab work done for LFTs.. Hit "B" for basement when you get on the elevator.  When the doors open the lab is on your left.  We will call you with the results. Thank you.  We have sent the following medications to your pharmacy for you to pick up at your convenience: Elavil 50mg : Take once daily as bedtime  Thank you for entrusting me with your care and for choosing [facility], Dr. [place] Cellar

## 2019-11-25 ENCOUNTER — Encounter: Payer: Self-pay | Admitting: Family Medicine

## 2019-11-25 ENCOUNTER — Ambulatory Visit (INDEPENDENT_AMBULATORY_CARE_PROVIDER_SITE_OTHER): Payer: Commercial Managed Care - PPO | Admitting: Family Medicine

## 2019-11-25 ENCOUNTER — Other Ambulatory Visit: Payer: Self-pay

## 2019-11-25 VITALS — BP 124/82 | HR 88 | Temp 97.7°F | Resp 16 | Ht 76.0 in | Wt 262.2 lb

## 2019-11-25 DIAGNOSIS — E669 Obesity, unspecified: Secondary | ICD-10-CM

## 2019-11-25 DIAGNOSIS — R7401 Elevation of levels of liver transaminase levels: Secondary | ICD-10-CM

## 2019-11-25 DIAGNOSIS — F411 Generalized anxiety disorder: Secondary | ICD-10-CM

## 2019-11-25 DIAGNOSIS — E66811 Obesity, class 1: Secondary | ICD-10-CM

## 2019-11-25 DIAGNOSIS — K760 Fatty (change of) liver, not elsewhere classified: Secondary | ICD-10-CM | POA: Diagnosis not present

## 2019-11-25 NOTE — Progress Notes (Signed)
OFFICE VISIT  11/25/2019   CC:  Chief Complaint  Patient presents with  . Follow-up    GAD, 6 month   HPI:    Patient is a 44 y.o. Caucasian male who presents for 6 mo f/u GAD. He has historically done very well on alpraz bid.  Has consistently taken this med responsibly w/out any adverse side effects.  Doing well still, anxiety stable/mood is fine.   PMP AWARE reviewed today: most recent rx for alprazolam 1mg  was filled 10/01/19, # 50, rx by me. No red flags.  Still struggling with his wt. Too busy to workout/exercise.  Eating habits not very healthy. Has a full gym in garage. 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 Has 41 y/o and 61 y/o sons.  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   Of note, he had f/u w/GI MD Dr. Havery Moros 11/12/19 for IBS-D and fatty liver dz. Doing fairly well on elavil 20mg  qd, increased to 40mg  if tolerated well.  Doing ok so far. His ALT had intervally risen so Dr. Havery Moros planned on checking hepatic panel again soon.    Past Medical History:  Diagnosis Date  . Allergy   . Anxiety    alpraz started about 2011  . Fatty liver    u/s 04/2017.  Very mild ALT elevations.  . IBS (irritable bowel syndrome)    -D.  Cholestyramine helpful.  . Insomnia   . Obesity, Class I, BMI 30-34.9   . Seasonal allergic rhinitis     Past Surgical History:  Procedure Laterality Date  . COLONOSCOPY W/ POLYPECTOMY  04/24/2016   polyps x 2= hyperplastic.  Random colon bx's NEG.  Recall 11 yrs (2028, age 21).    Outpatient Medications Prior to Visit  Medication Sig Dispense Refill  . ALPRAZolam (XANAX) 1 MG tablet TAKE 1 TO 2 TABLETS BY MOUTH TWICE DAILY AS NEEDED FOR ANXIETY 60 tablet 5  . amitriptyline (ELAVIL) 50 MG tablet Take 1 tablet (50 mg total) by mouth at bedtime. 90 tablet 3  . ibuprofen (ADVIL) 800 MG tablet Take 1 tablet (800 mg total) by mouth 3 (three) times daily as needed for fever. (Patient not taking: Reported on  11/25/2019) 21 tablet 0   No facility-administered medications prior to visit.    No Known Allergies  ROS As per HPI  PE: Blood pressure 124/82, pulse 88, temperature 97.7 F (36.5 C), temperature source Temporal, resp. rate 16, height 6\' 4"  (1.93 m), weight 262 lb 3.2 oz (118.9 kg), SpO2 96 %. Body mass index is 31.92 kg/m.  Gen: Alert, well appearing.  Patient is oriented to person, place, time, and situation. AFFECT: pleasant, lucid thought and speech. CV: RRR, no m/r/g.   LUNGS: CTA bilat, nonlabored resps, good aeration in all lung fields. EXT: no clubbing or cyanosis.  no edema.    LABS:    Chemistry      Component Value Date/Time   NA 136 07/02/2019 0733   K 3.9 07/02/2019 0733   CL 104 07/02/2019 0733   CO2 21 (L) 07/02/2019 0733   BUN 12 07/02/2019 0733   CREATININE 1.21 07/02/2019 0733      Component Value Date/Time   CALCIUM 9.0 07/02/2019 0733   ALKPHOS 67 07/02/2019 0733   AST 86 (H) 07/02/2019 0733   ALT 143 (H) 07/02/2019 0733   BILITOT 0.6 07/02/2019 0733     Lab Results  Component Value Date   WBC 5.4 07/02/2019  HGB 17.4 (H) 07/02/2019   HCT 49.5 07/02/2019   MCV 87.6 07/02/2019   PLT 159 07/02/2019   Lab Results  Component Value Date   TSH 3.45 05/04/2019   Lab Results  Component Value Date   CHOL 181 05/04/2019   HDL 32.10 (L) 05/04/2019   LDLCALC 115 (H) 05/04/2019   LDLDIRECT 111.0 07/03/2018   TRIG 167.0 (H) 05/04/2019   CHOLHDL 6 05/04/2019   IMPRESSION AND PLAN:  1) GAD: stable.  Continue xanax 1mg  bid. CSC renewed today. No new rx needed today.  2) NASH: he was going to go to the lab at [place] for repeat hepatic panel today but since he's here I'll get this lab and save pt a trip.  Will forward results to Dr. Havery Moros.  3) Obesity, BMI 31. He is struggling with getting into habit of exercising again. Life very busy, other priorities getting in the way. He has a home gym in garage so he will get started with this  as soon as he can. He will work on better diet choices, portion size, and try to spread meals out evenly throughout the day.  An After Visit Summary was printed and given to the patient.  FOLLOW UP: Return in about 6 months (around 05/26/2020) for annual CPE (fasting).  Signed:  Crissie Sickles, MD           11/25/2019

## 2019-11-26 LAB — HEPATIC FUNCTION PANEL
ALT: 91 U/L — ABNORMAL HIGH (ref 0–53)
AST: 47 U/L — ABNORMAL HIGH (ref 0–37)
Albumin: 4.8 g/dL (ref 3.5–5.2)
Alkaline Phosphatase: 67 U/L (ref 39–117)
Bilirubin, Direct: 0.1 mg/dL (ref 0.0–0.3)
Total Bilirubin: 0.7 mg/dL (ref 0.2–1.2)
Total Protein: 7.1 g/dL (ref 6.0–8.3)

## 2019-11-27 ENCOUNTER — Telehealth: Payer: Self-pay | Admitting: Gastroenterology

## 2019-11-27 NOTE — Telephone Encounter (Signed)
LFTs reviewed from recent lab draw. They are improved from previous. Continue to work on lifestyle changes as previously discussed and repeat LFTs in 6 months. Recall placed in our system for labs at that time.

## 2019-12-14 ENCOUNTER — Telehealth: Payer: Self-pay | Admitting: Gastroenterology

## 2019-12-14 NOTE — Telephone Encounter (Signed)
Called patient. LM for pt to call back to discuss in detail. He was on Elavil 20 mg nightly and was increased to 50 mg at last office visit on 11-12-19. Is pt needing a new prescription sent?

## 2019-12-16 MED ORDER — AMITRIPTYLINE HCL 10 MG PO TABS: 20.0000 mg | ORAL_TABLET | Freq: Every day | ORAL | 1 refills | Status: DC

## 2019-12-16 NOTE — Telephone Encounter (Signed)
Patient said he was tolerating the 20 mg fine.  Would like a refill sent to [facility] in [place].  Script sent

## 2019-12-16 NOTE — Telephone Encounter (Signed)
Called and spoke to pt. Elavil 50 mg worked well for a couple of days but then it started causing horrible diarrhea for hours at a time, often in the middle of the night. He has switched back to 20 mg at night but doesn't have many 10 mg tablets left.  Please advise.

## 2019-12-16 NOTE — Telephone Encounter (Signed)
Okay. This is quite odd, would not think Elavil would cause diarrhea like that. Is he doing okay on the 20mg  dose? If he wishes to continue at the lower dose that is fine, we can refill it. If he's still having symptoms at the lower dose we can consider other options. Thanks

## 2019-12-16 NOTE — Telephone Encounter (Signed)
Okay; thanks.

## 2020-01-20 ENCOUNTER — Other Ambulatory Visit: Payer: Self-pay | Admitting: Family Medicine

## 2020-01-21 NOTE — Telephone Encounter (Signed)
LAST APPOINTMENT DATE: 11/25/2019   NEXT APPOINTMENT DATE: Visit date not found  Rx Xanax  LAST REFILL: 11/18/2018  QTY: 60 5 rf

## 2020-05-12 ENCOUNTER — Telehealth: Payer: Self-pay

## 2020-05-12 DIAGNOSIS — E785 Hyperlipidemia, unspecified: Secondary | ICD-10-CM

## 2020-05-12 DIAGNOSIS — R7989 Other specified abnormal findings of blood chemistry: Secondary | ICD-10-CM

## 2020-05-12 NOTE — Telephone Encounter (Signed)
-----   Message from Roetta Sessions, [place] sent at 11/28/2019  7:48 AM EDT ----- Regarding: LFTs due LFTs due in 6 months = late October

## 2020-05-12 NOTE — Telephone Encounter (Signed)
Called and left detailed message for pt to go for labs for Dr. Havery Moros sometime in the next week or two (LFTs).

## 2020-05-31 ENCOUNTER — Other Ambulatory Visit: Payer: Self-pay | Admitting: Gastroenterology

## 2020-08-09 ENCOUNTER — Other Ambulatory Visit: Payer: Self-pay | Admitting: Family Medicine

## 2020-08-09 NOTE — Telephone Encounter (Signed)
OK, alpraz RF'd x 1. Needs f/u with me prior to any FURTHER rf's.

## 2020-08-09 NOTE — Telephone Encounter (Signed)
Requesting: alprazolam Contract: 11/03/18 UDS: 11/25/19 Last Visit:11/25/19 Next Visit: advised to f/u 6 mo. Last Refill: 01/21/20 (60,5)  Please Advise. Medication pending

## 2020-08-10 NOTE — Telephone Encounter (Signed)
Pt scheduled for f/u

## 2020-08-23 ENCOUNTER — Other Ambulatory Visit: Payer: Self-pay

## 2020-08-24 ENCOUNTER — Encounter: Payer: Self-pay | Admitting: Family Medicine

## 2020-08-24 ENCOUNTER — Ambulatory Visit (INDEPENDENT_AMBULATORY_CARE_PROVIDER_SITE_OTHER): Payer: Commercial Managed Care - PPO | Admitting: Family Medicine

## 2020-08-24 VITALS — BP 112/67 | HR 78 | Temp 98.0°F | Resp 16 | Ht 76.0 in | Wt 264.8 lb

## 2020-08-24 DIAGNOSIS — R7401 Elevation of levels of liver transaminase levels: Secondary | ICD-10-CM

## 2020-08-24 DIAGNOSIS — G4700 Insomnia, unspecified: Secondary | ICD-10-CM

## 2020-08-24 DIAGNOSIS — Z79899 Other long term (current) drug therapy: Secondary | ICD-10-CM | POA: Diagnosis not present

## 2020-08-24 DIAGNOSIS — K7581 Nonalcoholic steatohepatitis (NASH): Secondary | ICD-10-CM | POA: Diagnosis not present

## 2020-08-24 DIAGNOSIS — F411 Generalized anxiety disorder: Secondary | ICD-10-CM

## 2020-08-24 NOTE — Progress Notes (Signed)
OFFICE VISIT  08/24/2020  CC:  Chief Complaint  Patient presents with  . Follow-up    GAD    HPI:    Patient is a 45 y.o. Caucasian male who presents for f/u GAD with anxiety-induced insomnia.  Has historically done well with alpraz qd-bid. Still doing well with this, takes this med every single night. No depression, no panic. 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 He doesn't find much time in his diet for exercise, doesn't have healthy eating habits.  Hx of elevated LFTs/fatty liver, missed his most recent scheduled appt with Dr. Havery Moros due to acute resp illness. Asks if we can get his liver tests today and forward them to Dr. Havery Moros for convenience.  No abd pain, no n/v/d.   Past Medical History:  Diagnosis Date  . Allergy   . Anxiety    alpraz started about 2011  . Fatty liver    u/s 04/2017.  Very mild ALT elevations.  . IBS (irritable bowel syndrome)    -D.  Cholestyramine helpful.  . Insomnia   . Obesity, Class I, BMI 30-34.9   . Seasonal allergic rhinitis     Past Surgical History:  Procedure Laterality Date  . COLONOSCOPY W/ POLYPECTOMY  04/24/2016   polyps x 2= hyperplastic.  Random colon bx's NEG.  Recall 11 yrs (2028, age 39).   Social History   Socioeconomic History  . Marital status: Married    Spouse name: Not on file  . Number of children: 2  . Years of education: Not on file  . Highest education level: Not on file  Occupational History  . Not on file  Tobacco Use  . Smoking status: Never Smoker  . Smokeless tobacco: Never Used  Vaping Use  . Vaping Use: Never used  Substance and Sexual Activity  . Alcohol use: Yes    Alcohol/week: 0.0 standard drinks    Comment: OCC  . Drug use: No  . Sexual activity: Not on file  Other Topics Concern  . Not on file  Social History Narrative   Married, 2 sons, lives in [place].   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   College:  [employer], where he is originally from.   No T/A/Ds.   Social Determinants of Health   Financial Resource Strain: Not on file  Food Insecurity: Not on file  Transportation Needs: Not on file  Physical Activity: Not on file  Stress: Not on file  Social Connections: Not on file   Family History  Problem Relation Age of Onset  . Arthritis Father   . Colon cancer Neg Hx   . Prostate cancer Neg Hx   . Stomach cancer Neg Hx   . Pancreatic cancer Neg Hx   . Esophageal cancer Neg Hx     Outpatient Medications Prior to Visit  Medication Sig Dispense Refill  . ALPRAZolam (XANAX) 1 MG tablet TAKE 1 TO 2 TABLETS BY MOUTH TWICE DAILY AS NEEDED FOR ANXIETY 60 tablet 0  . amitriptyline (ELAVIL) 10 MG tablet TAKE 2 TABLETS BY MOUTH AT BEDTIME 180 tablet 0  . ibuprofen (ADVIL) 800 MG tablet Take 1 tablet (800 mg total) by mouth 3 (three) times daily as needed for fever. (Patient not taking: No sig reported) 21 tablet 0   No facility-administered medications prior to visit.    Allergies  Allergen Reactions  . Colestid [Colestipol] Other (See Comments)    Hypertriglyceridemia    ROS  As per HPI  PE: Vitals with BMI 08/24/2020 11/25/2019 11/12/2019  Height 6\' 4"  6\' 4"  6\' 0"   Weight 264 lbs 13 oz 262 lbs 3 oz 263 lbs  BMI 32.25 32.12 24.82  Systolic 500 370 488  Diastolic 67 82 80  Pulse 78 88 84    Gen: Alert, well appearing.  Patient is oriented to person, place, time, and situation. AFFECT: pleasant, lucid thought and speech. CV: RRR, no m/r/g.   LUNGS: CTA bilat, nonlabored resps, good aeration in all lung fields. EXT: no clubbing or cyanosis.  no edema.    LABS:  Lab Results  Component Value Date   TSH 3.45 05/04/2019   Lab Results  Component Value Date   WBC 5.4 07/02/2019   HGB 17.4 (H) 07/02/2019   HCT 49.5 07/02/2019   MCV 87.6 07/02/2019   PLT 159 07/02/2019   Lab Results  Component Value Date   CREATININE 1.21 07/02/2019   BUN 12 07/02/2019   NA  136 07/02/2019   K 3.9 07/02/2019   CL 104 07/02/2019   CO2 21 (L) 07/02/2019   Lab Results  Component Value Date   ALT 91 (H) 11/25/2019   AST 47 (H) 11/25/2019   ALKPHOS 67 11/25/2019   BILITOT 0.7 11/25/2019   Lab Results  Component Value Date   CHOL 181 05/04/2019   Lab Results  Component Value Date   HDL 32.10 (L) 05/04/2019   Lab Results  Component Value Date   LDLCALC 115 (H) 05/04/2019   Lab Results  Component Value Date   TRIG 167.0 (H) 05/04/2019   Lab Results  Component Value Date   CHOLHDL 6 05/04/2019   Lab Results  Component Value Date   HGBA1C 5.1 07/03/2018   IMPRESSION AND PLAN:  1) GAD with anxiety-induced insomnia: stable on alpraz 1mg  qhs. CSC UTD 10/2019. UDS today--should see benzo.  2) NASH: followed by Dr. Havery Moros. Unfortunately he's not making any headway with TLC/wt loss---life too hectic. Will go ahead and get cmet and flp today (pt last ate about 7 hours ago) and forward results to Dr. Havery Moros for pt's convenience.  An After Visit Summary was printed and given to the patient.  FOLLOW UP: Return in about 6 months (around 02/21/2021) for routine chronic illness f/u.  Signed:  Crissie Sickles, MD           08/24/2020

## 2020-08-25 LAB — COMPREHENSIVE METABOLIC PANEL
ALT: 58 U/L — ABNORMAL HIGH (ref 0–53)
AST: 28 U/L (ref 0–37)
Albumin: 4.7 g/dL (ref 3.5–5.2)
Alkaline Phosphatase: 63 U/L (ref 39–117)
BUN: 22 mg/dL (ref 6–23)
CO2: 32 mEq/L (ref 19–32)
Calcium: 9.8 mg/dL (ref 8.4–10.5)
Chloride: 103 mEq/L (ref 96–112)
Creatinine, Ser: 1.21 mg/dL (ref 0.40–1.50)
GFR: 73.4 mL/min (ref >60.00)
Glucose, Bld: 81 mg/dL (ref 70–99)
Potassium: 4.5 mEq/L (ref 3.5–5.1)
Sodium: 140 mEq/L (ref 135–145)
Total Bilirubin: 0.8 mg/dL (ref 0.2–1.2)
Total Protein: 7.2 g/dL (ref 6.0–8.3)

## 2020-08-25 LAB — LIPID PANEL
Cholesterol: 214 mg/dL — ABNORMAL HIGH (ref 0–200)
HDL: 39.2 mg/dL (ref >39.00)
LDL Cholesterol: 145 mg/dL — ABNORMAL HIGH (ref 0–99)
NonHDL: 174.93
Total CHOL/HDL Ratio: 5
Triglycerides: 150 mg/dL — ABNORMAL HIGH (ref 0.0–149.0)
VLDL: 30 mg/dL (ref 0.0–40.0)

## 2020-08-26 ENCOUNTER — Encounter: Payer: Self-pay | Admitting: Family Medicine

## 2020-08-26 LAB — DRUG MONITORING, PANEL 8 WITH CONFIRMATION, URINE
6 Acetylmorphine: NEGATIVE ng/mL (ref <10)
Alcohol Metabolites: NEGATIVE ng/mL
Alphahydroxyalprazolam: 308 ng/mL — ABNORMAL HIGH (ref <25)
Alphahydroxymidazolam: NEGATIVE ng/mL (ref <50)
Alphahydroxytriazolam: NEGATIVE ng/mL (ref <50)
Aminoclonazepam: NEGATIVE ng/mL (ref <25)
Amphetamines: NEGATIVE ng/mL (ref <500)
Benzodiazepines: POSITIVE ng/mL — AB (ref <100)
Buprenorphine, Urine: NEGATIVE ng/mL (ref <5)
Cocaine Metabolite: NEGATIVE ng/mL (ref <150)
Creatinine: 172.7 mg/dL
Hydroxyethylflurazepam: NEGATIVE ng/mL (ref <50)
Lorazepam: NEGATIVE ng/mL (ref <50)
MDMA: NEGATIVE ng/mL (ref <500)
Marijuana Metabolite: NEGATIVE ng/mL (ref <20)
Nordiazepam: NEGATIVE ng/mL (ref <50)
Opiates: NEGATIVE ng/mL (ref <100)
Oxazepam: NEGATIVE ng/mL (ref <50)
Oxidant: NEGATIVE ug/mL
Oxycodone: NEGATIVE ng/mL (ref <100)
Temazepam: NEGATIVE ng/mL (ref <50)
pH: 5.5 (ref 4.5–9.0)

## 2020-08-26 LAB — DM TEMPLATE

## 2020-08-30 ENCOUNTER — Other Ambulatory Visit: Payer: Self-pay

## 2020-08-30 DIAGNOSIS — R7989 Other specified abnormal findings of blood chemistry: Secondary | ICD-10-CM

## 2020-08-30 DIAGNOSIS — K760 Fatty (change of) liver, not elsewhere classified: Secondary | ICD-10-CM

## 2020-09-15 ENCOUNTER — Other Ambulatory Visit: Payer: Self-pay | Admitting: Gastroenterology

## 2020-09-15 ENCOUNTER — Other Ambulatory Visit: Payer: Self-pay | Admitting: Family Medicine

## 2020-09-15 NOTE — Telephone Encounter (Signed)
Requesting: xanax Contract:11/30/19 UDS: 08/25/19 Last Visit:08/25/19  Next Visit:6 month f/u Last Refill:08/09/20 (60,0)  Please Advise

## 2020-12-12 ENCOUNTER — Other Ambulatory Visit: Payer: Self-pay | Admitting: Gastroenterology

## 2021-02-19 ENCOUNTER — Other Ambulatory Visit: Payer: Self-pay | Admitting: Gastroenterology

## 2021-04-10 ENCOUNTER — Other Ambulatory Visit: Payer: Self-pay | Admitting: Family Medicine

## 2021-04-10 NOTE — Telephone Encounter (Signed)
Requesting: alprazolam Contract: 11/25/19 UDS: 08/24/20 Last Visit:08/24/20 Next Visit: advised to f/u 6 mo. Last Refill: 09/15/20(60,5)  Please Advise, med pending

## 2021-04-11 NOTE — Telephone Encounter (Signed)
Pt scheduled  

## 2021-04-11 NOTE — Telephone Encounter (Signed)
He is 3 months past due for f/u for this med (remind pt I must see him every 6 months or I can't continue prescribing this med). I'll rx 2 wk supply.

## 2021-04-26 ENCOUNTER — Ambulatory Visit: Payer: Commercial Managed Care - PPO | Admitting: Family Medicine

## 2021-05-01 ENCOUNTER — Telehealth: Payer: Self-pay

## 2021-05-01 NOTE — Telephone Encounter (Signed)
-----   Message from Yevette Edwards, RN sent at 08/30/2020 12:05 PM EST ----- Regarding: Labs Repeat hepatic function panel. Order in epic.

## 2021-05-01 NOTE — Telephone Encounter (Signed)
Left detailed vm reminding patient that he is due for repeat labs at this time. Advised that no appointment is necessary. Advised that he can stop by the lab in the basement at his convenience between 7:30 AM - 5 PM, Monday through Friday. Advised patient to give [place] a call back if he has any questions.

## 2021-05-03 ENCOUNTER — Encounter: Payer: Self-pay | Admitting: Family Medicine

## 2021-05-03 ENCOUNTER — Ambulatory Visit: Payer: BC Managed Care – PPO | Admitting: Family Medicine

## 2021-05-03 ENCOUNTER — Ambulatory Visit (INDEPENDENT_AMBULATORY_CARE_PROVIDER_SITE_OTHER): Payer: BC Managed Care – PPO | Admitting: Family Medicine

## 2021-05-03 ENCOUNTER — Other Ambulatory Visit: Payer: Self-pay

## 2021-05-03 VITALS — BP 137/83 | HR 88 | Temp 97.3°F | Resp 16 | Ht 76.0 in | Wt 276.2 lb

## 2021-05-03 DIAGNOSIS — R0609 Other forms of dyspnea: Secondary | ICD-10-CM

## 2021-05-03 DIAGNOSIS — F411 Generalized anxiety disorder: Secondary | ICD-10-CM | POA: Diagnosis not present

## 2021-05-03 DIAGNOSIS — F5105 Insomnia due to other mental disorder: Secondary | ICD-10-CM

## 2021-05-03 DIAGNOSIS — G9331 Postviral fatigue syndrome: Secondary | ICD-10-CM

## 2021-05-03 DIAGNOSIS — F99 Mental disorder, not otherwise specified: Secondary | ICD-10-CM

## 2021-05-03 MED ORDER — ALPRAZOLAM 1 MG PO TABS: ORAL_TABLET | ORAL | 5 refills | Status: DC

## 2021-05-03 NOTE — Progress Notes (Signed)
OFFICE VISIT  05/03/2021  CC:  Chief Complaint  Patient presents with   Follow-up    anxiety    HPI:    Patient is a 45 y.o. male who presents for f/u anxiety and anxiety-related insomnia. A/P as of last visit: "1) GAD with anxiety-induced insomnia: stable on alpraz 1mg  qhs. CSC UTD 10/2019. UDS today--should see benzo.   2) NASH: followed by Dr. Havery Moros. Unfortunately he's not making any headway with TLC/wt loss---life too hectic. Will go ahead and get cmet and flp today (pt last ate about 7 hours ago) and forward results to Dr. Havery Moros for pt's convenience."   INTERIM HX: Feeling well other than some feeling of winded/sob with signif physical exertion last several days. Just got over what he describes as a viral resp syndrome w/out fever or signif bronchitic cough. At rest and with normal activity level he feels well.  Anxiety well controlled taking xanax 1-2 times a day. 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  NASH: ALT had improved and AST normalized on last lab check 9 mo ago. Dr. Havery Moros recommended he get these repeated at his office in 6-12 mo.  PMP AWARE reviewed today: most recent rx for alprazolam was filled 04/11/21, # 30, rx by me. No red flags.  Past Medical History:  Diagnosis Date   Anxiety    alpraz started about 2011   Fatty liver    u/s 04/2017.  Very mild ALT elevations.   IBS (irritable bowel syndrome)    -D.  Cholestyramine helpful.   Insomnia    Obesity, Class I, BMI 30-34.9    Seasonal allergic rhinitis     Past Surgical History:  Procedure Laterality Date   COLONOSCOPY W/ POLYPECTOMY  04/24/2016   polyps x 2= hyperplastic.  Random colon bx's NEG.  Recall 11 yrs (2028, age 88).    Outpatient Medications Prior to Visit  Medication Sig Dispense Refill   ALPRAZolam (XANAX) 1 MG tablet TAKE 1 TO 2 TABLETS BY MOUTH TWICE DAILY AS NEEDED FOR  ANXIETY 30 tablet 0   amitriptyline (ELAVIL) 10 MG tablet Take 2 tablets (20 mg total) by mouth at bedtime. Please schedule an annual office visit for further refills. Thank you 180 tablet 0   No facility-administered medications prior to visit.    Allergies  Allergen Reactions   Colestid [Colestipol] Other (See Comments)    Hypertriglyceridemia   ROS As per HPI  PE: Vitals with BMI 05/03/2021 08/24/2020 11/25/2019  Height 6\' 4"  6\' 4"  6\' 4"   Weight 276 lbs 3 oz 264 lbs 13 oz 262 lbs 3 oz  BMI 33.63 69.45 03.88  Systolic 828 003 491  Diastolic 83 67 82  Pulse 88 78 88   Gen: Alert, well appearing.  Patient is oriented to person, place, time, and situation. AFFECT: pleasant, lucid thought and speech. CV: RRR, no m/r/g.   LUNGS: CTA bilat, nonlabored resps, good aeration in all lung fields. EXT: no clubbing or cyanosis.  no edema.    LABS:  Lab Results  Component Value Date   TSH 3.45 05/04/2019   Lab Results  Component Value Date   WBC 5.4 07/02/2019   HGB 17.4 (H) 07/02/2019   HCT 49.5 07/02/2019   MCV 87.6 07/02/2019   PLT 159 07/02/2019   Lab Results  Component Value Date   IRON 61 02/20/2016   FERRITIN 245.4 02/20/2016  Lab Results  Component Value Date   CREATININE 1.21 08/24/2020   BUN 22 08/24/2020   NA 140 08/24/2020   K 4.5 08/24/2020   CL 103 08/24/2020   CO2 32 08/24/2020   Lab Results  Component Value Date   ALT 58 (H) 08/24/2020   AST 28 08/24/2020   ALKPHOS 63 08/24/2020   BILITOT 0.8 08/24/2020   Lab Results  Component Value Date   CHOL 214 (H) 08/24/2020   Lab Results  Component Value Date   HDL 39.20 08/24/2020   Lab Results  Component Value Date   LDLCALC 145 (H) 08/24/2020   Lab Results  Component Value Date   TRIG 150.0 (H) 08/24/2020   Lab Results  Component Value Date   CHOLHDL 5 08/24/2020   Lab Results  Component Value Date   HGBA1C 5.1 07/03/2018   IMPRESSION AND PLAN:  1) GAD, anxiety-related  insomnia. Doing well on alpraz 1mg  bid prn. Last UDS 07/2020 appropriate results. Rx for alpraz 1mg , 1 bid prn, #60, RF x 5. UDS next visit.  2) Postviral fatigue/DOE: reassured, expect this to gradually resolve over the next couple weeks. Signs/symptoms to call or return for were reviewed and pt expressed understanding.  3) NASH: last LFTs very good. He has plans to get rpt liver panel at GI soon.  An After Visit Summary was printed and given to the patient.  FOLLOW UP: No follow-ups on file.  Signed:  Crissie Sickles, MD           05/03/2021

## 2021-05-19 ENCOUNTER — Other Ambulatory Visit: Payer: BC Managed Care – PPO

## 2021-05-19 DIAGNOSIS — R7989 Other specified abnormal findings of blood chemistry: Secondary | ICD-10-CM

## 2021-05-19 DIAGNOSIS — K760 Fatty (change of) liver, not elsewhere classified: Secondary | ICD-10-CM

## 2021-05-19 LAB — HEPATIC FUNCTION PANEL
ALT: 84 U/L — ABNORMAL HIGH (ref 0–53)
AST: 37 U/L (ref 0–37)
Albumin: 4.8 g/dL (ref 3.5–5.2)
Alkaline Phosphatase: 69 U/L (ref 39–117)
Bilirubin, Direct: 0.1 mg/dL (ref 0.0–0.3)
Total Bilirubin: 0.9 mg/dL (ref 0.2–1.2)
Total Protein: 7.4 g/dL (ref 6.0–8.3)

## 2021-05-25 ENCOUNTER — Other Ambulatory Visit: Payer: Self-pay | Admitting: Gastroenterology

## 2021-06-13 ENCOUNTER — Encounter: Payer: Self-pay | Admitting: Gastroenterology

## 2021-06-13 ENCOUNTER — Ambulatory Visit (INDEPENDENT_AMBULATORY_CARE_PROVIDER_SITE_OTHER): Payer: BC Managed Care – PPO | Admitting: Gastroenterology

## 2021-06-13 ENCOUNTER — Other Ambulatory Visit: Payer: BC Managed Care – PPO

## 2021-06-13 VITALS — BP 122/80 | HR 72 | Ht 74.0 in | Wt 275.0 lb

## 2021-06-13 DIAGNOSIS — K760 Fatty (change of) liver, not elsewhere classified: Secondary | ICD-10-CM

## 2021-06-13 DIAGNOSIS — Z6835 Body mass index (BMI) 35.0-35.9, adult: Secondary | ICD-10-CM

## 2021-06-13 DIAGNOSIS — K580 Irritable bowel syndrome with diarrhea: Secondary | ICD-10-CM | POA: Diagnosis not present

## 2021-06-13 MED ORDER — AMITRIPTYLINE HCL 10 MG PO TABS: 20.0000 mg | ORAL_TABLET | Freq: Every day | ORAL | 3 refills | Status: DC

## 2021-06-13 NOTE — Patient Instructions (Addendum)
If you are age 44 or older, your body mass index should be between 23-30. Your Body mass index is 35.31 kg/m. If this is out of the aforementioned range listed, please consider follow up with your Primary Care Provider.  If you are age 50 or younger, your body mass index should be between 19-25. Your Body mass index is 35.31 kg/m. If this is out of the aformentioned range listed, please consider follow up with your Primary Care Provider.   ________________________________________________________  The [facility] providers would like to encourage you to use [facility] to communicate with providers for non-urgent requests or questions.  Due to long hold times on the telephone, sending your provider a message by [facility] may be a faster and more efficient way to get a response.  Please allow 48 business hours for a response.  Please remember that this is for non-urgent requests.  _______________________________________________________   George Lam have been scheduled for an abdominal ultrasound with elastography at [facility] (1st floor). Your appointment is scheduled for Friday, November 18th at 8:30am. Please arrive 15 minutes prior to your scheduled appointment for registration purposes. Make certain not to have anything to eat or drink 6 hours prior to your procedure. Should you need to reschedule your appointment, you may contact radiology at [phone].  Liver Elastography Various chronic liver diseases such as hepatitis B, C, and fatty liver disease can lead to tissue damage and subsequent scar tissue formation. As the scar tissue accumulates, the liver loses some of its elasticity and becomes stiffer. Liver elastography involves the use of a surface ultrasound probe that delivers a low frequency pulse or shear wave to a small volume of liver tissue under the rib cage. The transmission of the sound wave is completely painless. How Is a Liver Elastography Performed? The liver is located in the  right upper abdomen under the rib cage. Patients are asked to lie flat on an examination table. A technician places the FibroScan probe between the ribs on the right side of the lower chest wall. A series of 10 painless pulses are then applied to the liver. The results are recorded on the equipment and an overall liver stiffness score is generated. This score is then interpreted by a qualified physician to predict the likelihood of advanced fibrosis or cirrhosis.  Patients are asked to wear loose clothing and should not consume any liquids or solids for a minimum of 4 hours before the test to increase the likelihood of obtaining reliable test results. The scan will take 10 to 15 minutes to complete, but patients should plan on being available for 30 minutes to allow time for preparation _____________________________________________________________  Please go to the lab in the basement of our building to have lab work done as you leave today. Hit "B" for basement when you get on the elevator.  When the doors open the lab is on your left.  We will call you with the results. Thank you.  We have sent the following medications to your pharmacy for you to pick up at your convenience: Elavil   Thank you for entrusting me with your care and for choosing [facility], Dr. [place]

## 2021-06-13 NOTE — Progress Notes (Signed)
HPI :  45 year old male here for follow-up visit for IBS-D and fatty liver disease. He has had a prior evaluation with negative celiac labs.  He has had a prior colonoscopy in 2017 which showed no evidence of microscopic colitis or IBD.  He has had a negative serologic work-up for liver disease.  Recall that I previously treated him with Colestid in the past for his loose stools and this worked remarkably well and he was quite happy with the regimen.  Unfortunately this caused elevated triglycerides and we decided to stop the drug.  We tried to get rifaximin for him but it was not well covered by insurance.  He did not do well with trial of Viberzi.  He has been on Elavil 20 mg nightly for the past few years and doing pretty well on it.  He states his bowels are pretty regular on the regimen.  It does not work as well as the [employer] but he tolerates it and it works well enough.  We had discussed titrating up to 50 mg nightly but he states he did not like the way it made him feel so he went back to 20 mg/day.  Generally doing well without any complaints on the regimen.  Otherwise we have followed him for fatty liver history.  He does not drink much of any alcohol.  He has had a prior negative serologic evaluation.  His weight has continued to go up since have last seen him.  The last visit he was 263 pounds about a year and a half ago.  Today he is about 275 pounds.  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  He states its been hard for him to lose weight given his schedule.  His ALT remains elevated, last checked on October 21 and level was 84 with a normal AST.  His ALT has fluctuated from 60s to 140 over the past few years.  His last ultrasound was in October 2018.     Colonoscopy - 04/24/16 - normal ileum, normal colon other than 2 small polyps - hyperplastic, small hemorhoids, biopsies taken - no  microscopic colitis     US abdomen 05/29/17 - IMPRESSION: 1. Two very small gallbladder polyps which are unchanged since July, 2017. No further imaging follow-up is felt necessary. 2. Diffuse hepatic steatosis with focal sparing adjacent to the gallbladder. No significant focal hepatic parenchymal abnormalities.    Past Medical History:  Diagnosis Date   Anxiety    alpraz started about 2011   Fatty liver    u/s 04/2017.  Very mild ALT elevations.   IBS (irritable bowel syndrome)    -D.  Cholestyramine helpful.   Insomnia    Obesity, Class I, BMI 30-34.9    Seasonal allergic rhinitis      Past Surgical History:  Procedure Laterality Date   COLONOSCOPY W/ POLYPECTOMY  04/24/2016   polyps x 2= hyperplastic.  Random colon bx's NEG.  Recall 11 yrs (2028, age 48).   Family History  Problem Relation Age of Onset   Arthritis Father    Colon cancer Neg Hx    Prostate cancer Neg Hx    Stomach cancer Neg Hx    Pancreatic cancer Neg Hx    Esophageal cancer Neg Hx    Social History   Tobacco Use   Smoking status: Never   Smokeless tobacco: Never  Vaping  Use   Vaping Use: Never used  Substance Use Topics   Alcohol use: Yes    Alcohol/week: 0.0 standard drinks    Comment: OCC   Drug use: No   Current Outpatient Medications  Medication Sig Dispense Refill   ALPRAZolam (XANAX) 1 MG tablet 1 tab po bid prn anxiety 60 tablet 5   amitriptyline (ELAVIL) 10 MG tablet Take 2 tablets (20 mg total) by mouth at bedtime. Please schedule an appointment for further refills. Thanks 60 tablet 0   No current facility-administered medications for this visit.   Allergies  Allergen Reactions   Colestid [Colestipol] Other (See Comments)    Hypertriglyceridemia     Review of Systems: All systems reviewed and negative except where noted in HPI.   Lab Results  Component Value Date   WBC 5.4 07/02/2019   HGB 17.4 (H) 07/02/2019   HCT 49.5 07/02/2019   MCV 87.6 07/02/2019   PLT 159  07/02/2019   Lab Results  Component Value Date   CREATININE 1.21 08/24/2020   BUN 22 08/24/2020   NA 140 08/24/2020   K 4.5 08/24/2020   CL 103 08/24/2020   CO2 32 08/24/2020    Lab Results  Component Value Date   ALT 84 (H) 05/19/2021   AST 37 05/19/2021   ALKPHOS 69 05/19/2021   BILITOT 0.9 05/19/2021     Physical Exam: BP 122/80   Pulse 72   Ht 6\' 2"  (1.88 m)   Wt 275 lb (124.7 kg)   BMI 35.31 kg/m  Constitutional: Pleasant,well-developed, male in no acute distress. Neurological: Alert and oriented to person place and time. Psychiatric: Normal mood and affect. Behavior is normal.   ASSESSMENT AND PLAN: 45 year old male here for reassessment following:  Fatty liver Obesity IBS-D  Ongoing ALT elevation in the setting of fatty liver which is the most likely etiology.  Serologic work-up otherwise negative.  Discussed risks for fibrotic change/NASH with him.  I am concerned about his weight gain in recent years with ongoing ALT elevation.  It has been a few years since he has last been imaged, discussed consideration for noninvasive evaluation for fibrotic change versus liver biopsy to assess for NASH and fibrosis.  After discussion of options he is interested in pursuing ultrasound with elastography, will refer for that.  We discussed importance for weight loss over time, he is having a hard time with this due to his lifestyle which is understandable.  I offered him referral to nutritionist or weight loss specialist to help with this, he declines that for now.  We will await his elastography with further recommendations.  We will continue to check LFTs at least once yearly.  We will otherwise check his immunity to hep A and hep B and vaccinate if needed.  He otherwise has been doing pretty well on Elavil since have last seen him.  He did not tolerate higher dosing of this but symptoms remain fairly well controlled on a lower dose.  He has tried a few options as outlined  above, for now we will continue Elavil as it is working fairly well and he tolerates it.  We will see him in 1 year for reassessment  Plan: - [place] with elastography  - weight loss recommended, offered assistance with this today, declined for now - Hep A total AB, hep B surface antibody - continue / refill Elavil  Jolly Mango, MD [facility]

## 2021-06-14 ENCOUNTER — Other Ambulatory Visit: Payer: Self-pay

## 2021-06-14 ENCOUNTER — Telehealth: Payer: Self-pay | Admitting: Gastroenterology

## 2021-06-14 LAB — HEPATITIS B SURFACE ANTIBODY,QUALITATIVE: Hep B S Ab: NONREACTIVE

## 2021-06-14 LAB — HEPATITIS A ANTIBODY, TOTAL: Hepatitis A AB,Total: NONREACTIVE

## 2021-06-14 NOTE — Telephone Encounter (Signed)
Patient called requesting more information about the [place] scheduled for 11/18.  Has questions.  Please call.  Thank you.

## 2021-06-14 NOTE — Telephone Encounter (Signed)
Returned call to patient. He wanted to make sure that the elastography would be done the same day as the [place]. I did confirm that patient will have an [place] with elastography at the same appt on Friday. Advised that he may be there for up to an hour. Pt is aware that he can resume normal activities after. Pt verbalized understanding and had no concerns at the end of the call.

## 2021-06-14 NOTE — Progress Notes (Signed)
Orders for Hep A and Heplisav entered

## 2021-06-15 ENCOUNTER — Ambulatory Visit: Payer: BC Managed Care – PPO | Admitting: Gastroenterology

## 2021-06-16 ENCOUNTER — Other Ambulatory Visit: Payer: Self-pay

## 2021-06-16 ENCOUNTER — Ambulatory Visit (HOSPITAL_COMMUNITY)
Admission: RE | Admit: 2021-06-16 | Discharge: 2021-06-16 | Disposition: A | Discharge status: Home / Self Care | Payer: BC Managed Care – PPO | Source: Ambulatory Visit | Attending: Gastroenterology | Admitting: Gastroenterology

## 2021-06-16 DIAGNOSIS — K580 Irritable bowel syndrome with diarrhea: Secondary | ICD-10-CM | POA: Diagnosis not present

## 2021-06-16 DIAGNOSIS — K760 Fatty (change of) liver, not elsewhere classified: Secondary | ICD-10-CM | POA: Insufficient documentation

## 2021-06-16 DIAGNOSIS — K824 Cholesterolosis of gallbladder: Secondary | ICD-10-CM | POA: Diagnosis not present

## 2021-06-16 DIAGNOSIS — Z6835 Body mass index (BMI) 35.0-35.9, adult: Secondary | ICD-10-CM | POA: Diagnosis not present

## 2021-06-30 ENCOUNTER — Ambulatory Visit (INDEPENDENT_AMBULATORY_CARE_PROVIDER_SITE_OTHER): Payer: BC Managed Care – PPO | Admitting: Gastroenterology

## 2021-06-30 DIAGNOSIS — Z23 Encounter for immunization: Secondary | ICD-10-CM | POA: Diagnosis not present

## 2021-06-30 DIAGNOSIS — K760 Fatty (change of) liver, not elsewhere classified: Secondary | ICD-10-CM

## 2021-08-01 ENCOUNTER — Ambulatory Visit (INDEPENDENT_AMBULATORY_CARE_PROVIDER_SITE_OTHER): Payer: BC Managed Care – PPO | Admitting: Gastroenterology

## 2021-08-01 DIAGNOSIS — Z23 Encounter for immunization: Secondary | ICD-10-CM

## 2021-08-03 ENCOUNTER — Encounter: Payer: Self-pay | Admitting: Gastroenterology

## 2021-08-03 NOTE — Progress Notes (Deleted)
Vaccine given; no complications

## 2021-11-18 ENCOUNTER — Other Ambulatory Visit: Payer: Self-pay | Admitting: Family Medicine

## 2021-11-20 NOTE — Telephone Encounter (Signed)
Pt last seen 05/03/21, last rx 05/03/21(60,5). ? ?Please review and advise ? ?

## 2021-11-20 NOTE — Telephone Encounter (Signed)
#  60 sent. ?Needs follow-up prior to any further refills. ?

## 2021-11-21 NOTE — Telephone Encounter (Signed)
Pt advised of refill. Offered to schedule appt, pt agreed. Appt 5/1 10:20a ?

## 2021-11-27 ENCOUNTER — Ambulatory Visit (INDEPENDENT_AMBULATORY_CARE_PROVIDER_SITE_OTHER): Payer: BC Managed Care – PPO | Admitting: Family Medicine

## 2021-11-27 ENCOUNTER — Encounter: Payer: Self-pay | Admitting: Family Medicine

## 2021-11-27 VITALS — BP 117/77 | HR 75 | Temp 97.9°F | Ht 74.0 in | Wt 278.2 lb

## 2021-11-27 DIAGNOSIS — F411 Generalized anxiety disorder: Secondary | ICD-10-CM

## 2021-11-27 DIAGNOSIS — F99 Mental disorder, not otherwise specified: Secondary | ICD-10-CM | POA: Diagnosis not present

## 2021-11-27 DIAGNOSIS — Z79899 Other long term (current) drug therapy: Secondary | ICD-10-CM | POA: Diagnosis not present

## 2021-11-27 DIAGNOSIS — F5105 Insomnia due to other mental disorder: Secondary | ICD-10-CM | POA: Diagnosis not present

## 2021-11-27 MED ORDER — ALPRAZOLAM 1 MG PO TABS: ORAL_TABLET | ORAL | 5 refills | Status: DC

## 2021-11-27 NOTE — Progress Notes (Signed)
OFFICE VISIT ? ?11/27/2021 ? ?CC:  ?Chief Complaint  ?Patient presents with  ? Anxiety  ? ? ?Patient is a 46 y.o. male who presents for 6 mo f/u anxiety and anxiety-related insomnia. ?A/P as of last visit: ?"1) GAD, anxiety-related insomnia. ?Doing well on alpraz '1mg'$  bid prn. ?Last UDS 07/2020 appropriate results. ?Rx for alpraz '1mg'$ , 1 bid prn, #60, RF x 5. ?UDS next visit. ?  ?2) Postviral fatigue/DOE: reassured, expect this to gradually resolve over the next couple weeks. ?Signs/symptoms to call or return for were reviewed and pt expressed understanding. ?  ?3) NASH: last LFTs very good. ?He has plans to get rpt liver panel at GI soon." ? ?INTERIM HX: ?Sonia Side feels well. 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 ? ?Not doing any regular exercise himself, however. ?Not working on diet very much. ? ?GI f/u for fatty liver 05/2021, pt got [place] abd RUQ with elastography-->all normal except unchanged hepatic steatosis and some benign polyps in GB.   ? ?PMP AWARE reviewed today: most recent rx for alprazolam '1mg'$  was filled 11/20/21, # 63, rx by me. ?No red flags. ? ?Past Medical History:  ?Diagnosis Date  ? Anxiety   ? Sharin Grave started about 2011  ? Fatty liver   ? u/s 04/2017.  Very mild ALT elevations.  ? IBS (irritable bowel syndrome)   ? -D.  Cholestyramine helpful.  ? Insomnia   ? Obesity, Class I, BMI 30-34.9   ? Seasonal allergic rhinitis   ? ? ?Past Surgical History:  ?Procedure Laterality Date  ? COLONOSCOPY W/ POLYPECTOMY  04/24/2016  ? polyps x 2= hyperplastic.  Random colon bx's NEG.  Recall 11 yrs (2028, age 74).  ? ? ?Outpatient Medications Prior to Visit  ?Medication Sig Dispense Refill  ? amitriptyline (ELAVIL) 10 MG tablet Take 2 tablets (20 mg total) by mouth at bedtime. 180 tablet 3  ? ALPRAZolam (XANAX) 1 MG tablet Take 1 tablet by mouth twice daily as needed for anxiety 60 tablet 0  ? ?No facility-administered medications prior to visit.  ? ? ?Allergies   ?Allergen Reactions  ? Colestid [Colestipol] Other (See Comments)  ?  Hypertriglyceridemia  ? ?ROS ?As per HPI ? ?PE: ? ?  11/27/2021  ? 10:28 AM 06/13/2021  ? 10:08 AM 05/03/2021  ?  3:12 PM  ?Vitals with BMI  ?Height '6\' 2"'$  '6\' 2"'$  '6\' 4"'$   ?Weight 278 lbs 3 oz 275 lbs 276 lbs 3 oz  ?BMI 35.7 35.29 33.63  ?Systolic 381 829 937  ?Diastolic 77 80 83  ?Pulse 75 72 88  ? ? ?Physical Exam ? ?Gen: Alert, well appearing.  Patient is oriented to person, place, time, and situation. ?AFFECT: pleasant, lucid thought and speech. ?CV: RRR, no m/r/g.   ?LUNGS: CTA bilat, nonlabored resps, good aeration in all lung fields. ?EXT: no clubbing or cyanosis.  no edema.  ? ? ?LABS:  ?Last CBC ?Lab Results  ?Component Value Date  ? WBC 5.4 07/02/2019  ? HGB 17.4 (H) 07/02/2019  ? HCT 49.5 07/02/2019  ? MCV 87.6 07/02/2019  ? MCH 30.8 07/02/2019  ? RDW 12.6 07/02/2019  ? PLT 159 07/02/2019  ? ?Lab Results  ?Component Value Date  ? IRON 61 02/20/2016  ? FERRITIN 245.4 02/20/2016  ? ?Last metabolic panel ?Lab Results  ?Component Value Date  ? GLUCOSE 81 08/24/2020  ? NA 140 08/24/2020  ? K 4.5 08/24/2020  ? CL 103 08/24/2020  ?  CO2 32 08/24/2020  ? BUN 22 08/24/2020  ? CREATININE 1.21 08/24/2020  ? GFRNONAA >60 07/02/2019  ? CALCIUM 9.8 08/24/2020  ? PROT 7.4 05/19/2021  ? ALBUMIN 4.8 05/19/2021  ? BILITOT 0.9 05/19/2021  ? ALKPHOS 69 05/19/2021  ? AST 37 05/19/2021  ? ALT 84 (H) 05/19/2021  ? ANIONGAP 11 07/02/2019  ? ?Last lipids ?Lab Results  ?Component Value Date  ? CHOL 214 (H) 08/24/2020  ? HDL 39.20 08/24/2020  ? [place] 145 (H) 08/24/2020  ? LDLDIRECT 111.0 07/03/2018  ? TRIG 150.0 (H) 08/24/2020  ? CHOLHDL 5 08/24/2020  ? ?Last hemoglobin A1c ?Lab Results  ?Component Value Date  ? HGBA1C 5.1 07/03/2018  ? ?Last thyroid functions ?Lab Results  ?Component Value Date  ? TSH 3.45 05/04/2019  ? ?IMPRESSION AND PLAN: ? ?#1 GAD, anxiety-related insomnia. ?Doing well on alprazolam 1 mg twice daily and amitriptyline 20 mg  nightly--long-term. ?Controlled substance contract updated. ?Urine drug screen today. ? ?2.  Elevated transaminase, hepatic steatosis. ?Followed by Dr. Havery Moros and GI. ?Ultrasound stable, normally elastography 05/2021. ?ALT stable at 84 at last check Oct 2022. ?Remainder of hepatic panel normal at that time. ?Plan recheck hepatic panel and lipid 6 months. ?He knows he needs to improve lifestyle habits.  He plans on joining [employer] in [place] soon. ? ?An After Visit Summary was printed and given to the patient. ? ?FOLLOW UP: Return in about 6 months (around 05/30/2022) for annual CPE (fasting). ? ?Signed:  Crissie Sickles, MD           11/27/2021 ? ?

## 2021-11-29 LAB — DRUG MONITORING PANEL 376104, URINE

## 2021-11-29 LAB — DM TEMPLATE

## 2021-12-18 ENCOUNTER — Telehealth: Payer: Self-pay

## 2021-12-18 NOTE — Telephone Encounter (Signed)
Lm on vm for patient to return call.   Due for 2nd Hep A on 6/3 or after.

## 2021-12-18 NOTE — Telephone Encounter (Signed)
Pt scheduled for 01/01/22 at 8:320 am. See patient message.

## 2021-12-18 NOTE — Telephone Encounter (Signed)
-----   Message from [place] sent at 08/02/2021 10:32 AM EST ----- Patient due for 2nd hep A - first one 06/30/2021

## 2022-01-01 ENCOUNTER — Ambulatory Visit (INDEPENDENT_AMBULATORY_CARE_PROVIDER_SITE_OTHER): Payer: BC Managed Care – PPO | Admitting: Gastroenterology

## 2022-01-01 DIAGNOSIS — Z23 Encounter for immunization: Secondary | ICD-10-CM | POA: Diagnosis not present

## 2022-04-05 DIAGNOSIS — L820 Inflamed seborrheic keratosis: Secondary | ICD-10-CM | POA: Diagnosis not present

## 2022-06-13 ENCOUNTER — Other Ambulatory Visit: Payer: Self-pay | Admitting: Gastroenterology

## 2022-06-29 ENCOUNTER — Telehealth: Payer: BC Managed Care – PPO | Admitting: Family Medicine

## 2022-06-29 DIAGNOSIS — R052 Subacute cough: Secondary | ICD-10-CM | POA: Diagnosis not present

## 2022-06-29 DIAGNOSIS — J019 Acute sinusitis, unspecified: Secondary | ICD-10-CM | POA: Diagnosis not present

## 2022-06-29 DIAGNOSIS — B9689 Other specified bacterial agents as the cause of diseases classified elsewhere: Secondary | ICD-10-CM | POA: Diagnosis not present

## 2022-06-29 DIAGNOSIS — R051 Acute cough: Secondary | ICD-10-CM

## 2022-06-29 DIAGNOSIS — R509 Fever, unspecified: Secondary | ICD-10-CM

## 2022-06-29 MED ORDER — AMOXICILLIN-POT CLAVULANATE 875-125 MG PO TABS: 1.0000 | ORAL_TABLET | Freq: Two times a day (BID) | ORAL | 0 refills | Status: AC

## 2022-06-29 MED ORDER — PREDNISONE 20 MG PO TABS: 40.0000 mg | ORAL_TABLET | Freq: Every day | ORAL | 0 refills | Status: AC

## 2022-06-29 MED ORDER — PROMETHAZINE-DM 6.25-15 MG/5ML PO SYRP: 5.0000 mL | ORAL_SOLUTION | Freq: Three times a day (TID) | ORAL | 0 refills | Status: DC | PRN

## 2022-06-29 NOTE — Progress Notes (Signed)
Virtual Visit Consent   George Lam, you are scheduled for a virtual visit with a [facility] provider today. Just as with appointments in the office, your consent must be obtained to participate. Your consent will be active for this visit and any virtual visit you may have with one of our providers in the next 365 days. If you have a MyChart account, a copy of this consent can be sent to you electronically.  As this is a virtual visit, video technology does not allow for your provider to perform a traditional examination. This may limit your provider's ability to fully assess your condition. If your provider identifies any concerns that need to be evaluated in person or the need to arrange testing (such as labs, EKG, etc.), we will make arrangements to do so. Although advances in technology are sophisticated, we cannot ensure that it will always work on either your end or our end. If the connection with a video visit is poor, the visit may have to be switched to a telephone visit. With either a video or telephone visit, we are not always able to ensure that we have a secure connection.  By engaging in this virtual visit, you consent to the provision of healthcare and authorize for your insurance to be billed (if applicable) for the services provided during this visit. Depending on your insurance coverage, you may receive a charge related to this service.  I need to obtain your verbal consent now. Are you willing to proceed with your visit today? George Lam has provided verbal consent on 06/29/2022 for a virtual visit (video or telephone). Perlie Mayo, NP  Date: 06/29/2022 12:16 PM  Virtual Visit via Video Note   I, Perlie Mayo, connected with  George Lam  ([id number], 03/20/1976) on 06/29/22 at 12:15 PM EST by a video-enabled telemedicine application and verified that I am speaking with the correct person using two identifiers.  Location: Patient: Virtual Visit Location Patient:  Home Provider: Virtual Visit Location Provider: Home Office   I discussed the limitations of evaluation and management by telemedicine and the availability of in person appointments. The patient expressed understanding and agreed to proceed.    History of Present Illness: George Lam is a 46 y.o. who identifies as a male who was assigned male at birth, and is being seen today for 3 weeks of cough and congestion.  Was seen in ED- neg for Flu, RSV, and Covid. Had a chest xray that was negative, labs unremarkable or bacterial infection at that time 06/20/2022. Reports overall some improvement with energy and appetite, but coughing and mucus production. Coughing so hard it is causing chest/ rib pain. Denies fevers, chills, shortness of breath.   Problems:  Patient Active Problem List   Diagnosis Date Noted   Obesity (BMI 30-39.9) 05/04/2019   Chronic diarrhea 04/11/2016    Allergies:  Allergies  Allergen Reactions   Colestid [Colestipol] Other (See Comments)    Hypertriglyceridemia   Medications:  Current Outpatient Medications:    ALPRAZolam (XANAX) 1 MG tablet, Take 1 tablet by mouth twice daily as needed for anxiety, Disp: 60 tablet, Rfl: 5   amitriptyline (ELAVIL) 10 MG tablet, Take 2 tablets (20 mg total) by mouth at bedtime. Please schedule an office visit for further refills, Disp: 60 tablet, Rfl: 1  Observations/Objective: Patient is well-developed, well-nourished in no acute distress.  Resting comfortably  at home.  Head is normocephalic, atraumatic.  No labored breathing.  Speech is clear and coherent  with logical content.  Patient is alert and oriented at baseline.  Dry cough  Assessment and Plan:   1. Acute bacterial sinusitis  - amoxicillin-clavulanate (AUGMENTIN) 875-125 MG tablet; Take 1 tablet by mouth 2 (two) times daily for 7 days.  Dispense: 14 tablet; Refill: 0  2. Subacute cough  - predniSONE (DELTASONE) 20 MG tablet; Take 2 tablets (40 mg total) by  mouth daily with breakfast for 5 days.  Dispense: 10 tablet; Refill: 0 - promethazine-dextromethorphan (PROMETHAZINE-DM) 6.25-15 MG/5ML syrup; Take 5 mLs by mouth 3 (three) times daily as needed for cough.  Dispense: 118 mL; Refill: 0   -Take meds as prescribed -Rest -Use a cool mist humidifier especially during the winter months when heat dries out the air. - Use saline nose sprays frequently to help soothe nasal passages and promote drainage. -Saline irrigations of the nose can be very helpful if done frequently.             * 4X daily for 1 week*             * Use of a nettie pot can be helpful with this.  *Follow directions with this* *Boiled or distilled water only -stay hydrated by drinking plenty of fluids - Keep thermostat turn down low to prevent drying out sinuses - For any cough or congestion- robitussin DM or Delsym as needed - For fever or aches or pains- take tylenol or ibuprofen as directed on bottle             * for fevers greater than 101 orally you may alternate ibuprofen and tylenol every 3 hours.  If you do not improve you will need a follow up visit in person.              Reviewed side effects, risks and benefits of medication.    Patient acknowledged agreement and understanding of the plan.   Past Medical, Surgical, Social History, Allergies, and Medications have been Reviewed.    Follow Up Instructions: I discussed the assessment and treatment plan with the patient. The patient was provided an opportunity to ask questions and all were answered. The patient agreed with the plan and demonstrated an understanding of the instructions.  A copy of instructions were sent to the patient via MyChart unless otherwise noted below.     The patient was advised to call back or seek an in-person evaluation if the symptoms worsen or if the condition fails to improve as anticipated.  Time:  I spent 10 minutes with the patient via telehealth technology discussing the above  problems/concerns.    Perlie Mayo, NP

## 2022-06-29 NOTE — Patient Instructions (Signed)
George Lam, thank you for joining Perlie Mayo, NP for today's virtual visit.  While this provider is not your primary care provider (PCP), if your PCP is located in our provider database this encounter information will be shared with them immediately following your visit.   [facility] account gives you access to today's visit and all your visits, tests, and labs performed at [facility] " click here if you don't have a [facility] account or go to [email or web address]  Consent: (Patient) George Lam provided verbal consent for this virtual visit at the beginning of the encounter.  Current Medications:  Current Outpatient Medications:    amoxicillin-clavulanate (AUGMENTIN) 875-125 MG tablet, Take 1 tablet by mouth 2 (two) times daily for 7 days., Disp: 14 tablet, Rfl: 0   predniSONE (DELTASONE) 20 MG tablet, Take 2 tablets (40 mg total) by mouth daily with breakfast for 5 days., Disp: 10 tablet, Rfl: 0   promethazine-dextromethorphan (PROMETHAZINE-DM) 6.25-15 MG/5ML syrup, Take 5 mLs by mouth 3 (three) times daily as needed for cough., Disp: 118 mL, Rfl: 0   ALPRAZolam (XANAX) 1 MG tablet, Take 1 tablet by mouth twice daily as needed for anxiety, Disp: 60 tablet, Rfl: 5   amitriptyline (ELAVIL) 10 MG tablet, Take 2 tablets (20 mg total) by mouth at bedtime. Please schedule an office visit for further refills, Disp: 60 tablet, Rfl: 1   Medications ordered in this encounter:  Meds ordered this encounter  Medications   amoxicillin-clavulanate (AUGMENTIN) 875-125 MG tablet    Sig: Take 1 tablet by mouth 2 (two) times daily for 7 days.    Dispense:  14 tablet    Refill:  0    Order Specific Question:   Supervising Provider    Answer:   Chase Picket [[id number]]   predniSONE (DELTASONE) 20 MG tablet    Sig: Take 2 tablets (40 mg total) by mouth daily with breakfast for 5 days.    Dispense:  10 tablet    Refill:  0    Order Specific Question:    Supervising Provider    Answer:   Chase Picket [id number]   promethazine-dextromethorphan (PROMETHAZINE-DM) 6.25-15 MG/5ML syrup    Sig: Take 5 mLs by mouth 3 (three) times daily as needed for cough.    Dispense:  118 mL    Refill:  0    Order Specific Question:   Supervising Provider    Answer:   Chase Picket [[id number]]     *If you need refills on other medications prior to your next appointment, please contact your pharmacy*  Follow-Up: Call back or seek an in-person evaluation if the symptoms worsen or if the condition fails to improve as anticipated.  [facility] [facility] [phone]  Other Instructions  -Take meds as prescribed -Rest -Use a cool mist humidifier especially during the winter months when heat dries out the air. - Use saline nose sprays frequently to help soothe nasal passages and promote drainage. -Saline irrigations of the nose can be very helpful if done frequently.             * 4X daily for 1 week*             * Use of a nettie pot can be helpful with this.  *Follow directions with this* *Boiled or distilled water only -stay hydrated by drinking plenty of fluids - Keep thermostat turn down low to prevent drying out sinuses - For any cough or  congestion- robitussin DM or Delsym as needed - For fever or aches or pains- take tylenol or ibuprofen as directed on bottle             * for fevers greater than 101 orally you may alternate ibuprofen and tylenol every 3 hours.  If you do not improve you will need a follow up visit in person.                  If you have been instructed to have an in-person evaluation today at a local Urgent Care facility, please use the link below. It will take you to a list of all of our available [facility], including address, phone number and hours of operation. Please do not delay care.  [facility]  If you or a family member do not have a primary care provider, use the link below to  schedule a visit and establish care. When you choose a [facility] primary care physician or advanced practice provider, you gain a long-term partner in health. Find a Primary Care Provider  Learn more about 's in-office and virtual care options: [facility]

## 2022-06-29 NOTE — Progress Notes (Signed)
Recent hospital visit in [place]- concern for CAP  Continued fevers and symptoms   [place]

## 2022-07-09 ENCOUNTER — Ambulatory Visit (HOSPITAL_BASED_OUTPATIENT_CLINIC_OR_DEPARTMENT_OTHER)
Admission: RE | Admit: 2022-07-09 | Discharge: 2022-07-09 | Disposition: A | Discharge status: Home / Self Care | Payer: BC Managed Care – PPO | Source: Ambulatory Visit | Attending: Family Medicine | Admitting: Family Medicine

## 2022-07-09 ENCOUNTER — Encounter: Payer: Self-pay | Admitting: Family Medicine

## 2022-07-09 ENCOUNTER — Ambulatory Visit (INDEPENDENT_AMBULATORY_CARE_PROVIDER_SITE_OTHER): Payer: BC Managed Care – PPO | Admitting: Family Medicine

## 2022-07-09 VITALS — BP 120/81 | HR 89 | Temp 98.2°F | Ht 74.0 in | Wt 285.8 lb

## 2022-07-09 DIAGNOSIS — J209 Acute bronchitis, unspecified: Secondary | ICD-10-CM | POA: Diagnosis not present

## 2022-07-09 DIAGNOSIS — R059 Cough, unspecified: Secondary | ICD-10-CM | POA: Diagnosis not present

## 2022-07-09 MED ORDER — HYDROCODONE BIT-HOMATROP MBR 5-1.5 MG/5ML PO SOLN: ORAL | 0 refills | Status: DC

## 2022-07-09 MED ORDER — ALBUTEROL SULFATE HFA 108 (90 BASE) MCG/ACT IN AERS: 2.0000 | INHALATION_SPRAY | Freq: Four times a day (QID) | RESPIRATORY_TRACT | 1 refills | Status: DC | PRN

## 2022-07-09 MED ORDER — PREDNISONE 10 MG PO TABS: ORAL_TABLET | ORAL | 0 refills | Status: DC

## 2022-07-09 MED ORDER — METHYLPREDNISOLONE ACETATE 80 MG/ML IJ SUSP
80.0000 mg | Freq: Once | INTRAMUSCULAR | Status: AC
  Administered 2022-07-09: 80 mg via INTRAMUSCULAR

## 2022-07-09 NOTE — Progress Notes (Signed)
OFFICE VISIT  07/09/2022  CC:  Chief Complaint  Patient presents with   Fatigue    Extreme    Fever    Hot and cold, chills; lat covid test completed was before Thanksgiving. Has been taking Ibuprofen '600mg'$ , last dose last night.    Cough    Non-stop at night, drinking or eating something cold makes him cough. Really dry cough   Breathing issues    Short of breath, mainly with inhalation   Patient is a 46 y.o. male who presents for respiratory illness and fatigue.  HPI: Patient describes approximately 5 to 6-week history of respiratory symptoms. Initially started with nasal congestion, postnasal drip, and cough as well as some fever and chills.  Very fatigued.  Symptoms worse with exposure to the cold, even with cold drinks. P.o. intake is good.  COVID test about 10 days ago was negative.  Patient was seen for virtual visit on 06/29/2022, diagnosed with sinusitis and bronchitis and was prescribed Augmentin and a 5-day course of prednisone. About a week prior to that he had an evaluation for the same illness in [place] where x-ray and lab work were reportedly normal.  He says the illness significantly improved only 1 day or so after getting on the prednisone and Augmentin.  However when he finished these meds his symptoms returned-> primarily dry cough.  ROS as above, plus--> no CP, no SOB, no wheezing, no dizziness, no HAs, no rashes, no melena/hematochezia.  No polyuria or polydipsia.  No myalgias or arthralgias.  No focal weakness, paresthesias, or tremors.  No acute vision or hearing abnormalities.  No dysuria or unusual/new urinary urgency or frequency.  No recent changes in lower legs. No n/v/d or abd pain.  No palpitations.    Past Medical History:  Diagnosis Date   Anxiety    alpraz started about 2011   Fatty liver    u/s 04/2017.  Very mild ALT elevations.   IBS (irritable bowel syndrome)    -D.  Cholestyramine helpful.   Insomnia    Obesity, Class I, BMI 30-34.9     Seasonal allergic rhinitis     Past Surgical History:  Procedure Laterality Date   COLONOSCOPY W/ POLYPECTOMY  04/24/2016   polyps x 2= hyperplastic.  Random colon bx's NEG.  Recall 11 yrs (2028, age 10).    Outpatient Medications Prior to Visit  Medication Sig Dispense Refill   ALPRAZolam (XANAX) 1 MG tablet Take 1 tablet by mouth twice daily as needed for anxiety 60 tablet 5   amitriptyline (ELAVIL) 10 MG tablet Take 2 tablets (20 mg total) by mouth at bedtime. Please schedule an office visit for further refills 60 tablet 1   promethazine-dextromethorphan (PROMETHAZINE-DM) 6.25-15 MG/5ML syrup Take 5 mLs by mouth 3 (three) times daily as needed for cough. (Patient not taking: Reported on 07/09/2022) 118 mL 0   No facility-administered medications prior to visit.    Allergies  Allergen Reactions   Colestid [Colestipol] Other (See Comments)    Hypertriglyceridemia    ROS As per HPI  PE:    07/09/2022   10:38 AM 11/27/2021   10:28 AM 06/13/2021   10:08 AM  Vitals with BMI  Height '6\' 2"'$  '6\' 2"'$  '6\' 2"'$   Weight 285 lbs 13 oz 278 lbs 3 oz 275 lbs  BMI 36.68 01.0 93.23  Systolic 557 322 025  Diastolic 81 77 80  Pulse 89 75 72    Physical Exam  Gen: Alert, well appearing.  Patient is  oriented to person, place, time, and situation. AFFECT: pleasant, lucid thought and speech. UMP:NTIR: no injection, icteris, swelling, or exudate.  EOMI, PERRLA. Mouth: lips without lesion/swelling.  Oral mucosa pink and moist. Oropharynx without erythema, exudate, or swelling.  Neck - No masses or thyromegaly or limitation in range of motion CV: RRR, no m/r/g.   LUNGS: CTA bilat, nonlabored resps, good aeration in all lung fields. EXT: no clubbing or cyanosis.  no edema.    LABS:  Last CBC Lab Results  Component Value Date   WBC 5.4 07/02/2019   HGB 17.4 (H) 07/02/2019   HCT 49.5 07/02/2019   MCV 87.6 07/02/2019   MCH 30.8 07/02/2019   RDW 12.6 07/02/2019   PLT 159 44/31/5400    Last metabolic panel Lab Results  Component Value Date   GLUCOSE 81 08/24/2020   NA 140 08/24/2020   K 4.5 08/24/2020   CL 103 08/24/2020   CO2 32 08/24/2020   BUN 22 08/24/2020   CREATININE 1.21 08/24/2020   GFRNONAA >60 07/02/2019   CALCIUM 9.8 08/24/2020   PROT 7.4 05/19/2021   ALBUMIN 4.8 05/19/2021   BILITOT 0.9 05/19/2021   ALKPHOS 69 05/19/2021   AST 37 05/19/2021   ALT 84 (H) 05/19/2021   ANIONGAP 11 07/02/2019   IMPRESSION AND PLAN:  Prolonged respiratory illness.  Chest x-ray ordered. Depo-Medrol 80 mg IM in the office today. Tomorrow start prednisone taper: 40 x 3, 30 x 3, 20 x 3, 10 x 3. No antibiotics at this time. Hycodan syrup, 1 to 2 teaspoons nightly as needed. Albuterol inhaler 1 to 2 puffs every 6 hours as needed.  An After Visit Summary was printed and given to the patient.  FOLLOW UP: Return in about 1 week (around 07/16/2022) for f/u bronchitis.  Signed:  Crissie Sickles, MD           07/09/2022

## 2022-08-03 ENCOUNTER — Other Ambulatory Visit: Payer: Self-pay | Admitting: Gastroenterology

## 2022-08-06 ENCOUNTER — Encounter: Payer: Self-pay | Admitting: Gastroenterology

## 2022-08-14 ENCOUNTER — Other Ambulatory Visit: Payer: Self-pay | Admitting: Family Medicine

## 2022-08-15 NOTE — Telephone Encounter (Signed)
Requesting: alprazolam Contract: 11/24/21 UDS: 11/27/21 Last Visit: 07/09/22, acute Next Visit: n/a Last Refill: 11/27/21 (60,5)  Please Advise. Med pending

## 2022-09-09 ENCOUNTER — Other Ambulatory Visit: Payer: Self-pay | Admitting: Gastroenterology

## 2022-09-25 ENCOUNTER — Encounter: Payer: Self-pay | Admitting: Family Medicine

## 2022-09-25 NOTE — Telephone Encounter (Signed)
Start at age 47 usually.  However, if family history of prostate cancer then started age 48. Due for CPE/follow-up anxiety and we will do labs at that time (fasting). --PM

## 2022-10-04 ENCOUNTER — Other Ambulatory Visit: Payer: Self-pay | Admitting: Gastroenterology

## 2022-10-15 ENCOUNTER — Other Ambulatory Visit: Payer: Self-pay | Admitting: Gastroenterology

## 2022-10-16 ENCOUNTER — Other Ambulatory Visit: Payer: Self-pay | Admitting: Family Medicine

## 2022-10-17 ENCOUNTER — Telehealth: Payer: Self-pay | Admitting: Gastroenterology

## 2022-10-17 NOTE — Telephone Encounter (Signed)
Patient is calling to request a refill on medication amitriptyline . Please advise

## 2022-10-17 NOTE — Telephone Encounter (Signed)
Requesting: alprazolam Contract: 11/24/21 UDS: 11/27/21 Last Visit: 07/09/22, acute Next Visit: 6 mo f/u cpe (Nov) not completed Last Refill: 08/15/22 (60,0)  Please Advise. Med pending

## 2022-10-17 NOTE — Telephone Encounter (Signed)
#  30 alprazolam sent. Patient due for follow-up. 

## 2022-10-18 ENCOUNTER — Telehealth: Payer: Self-pay | Admitting: Gastroenterology

## 2022-10-18 MED ORDER — AMITRIPTYLINE HCL 10 MG PO TABS: ORAL_TABLET | ORAL | 2 refills | Status: DC

## 2022-10-18 NOTE — Telephone Encounter (Signed)
LVM to patient to let him know that we need a OV in order to refill amitriptyline .

## 2022-10-18 NOTE — Telephone Encounter (Signed)
Refill sent to get patient to June appt

## 2022-10-18 NOTE — Telephone Encounter (Signed)
Patient scheduled for next available ov 6/13.

## 2022-11-01 ENCOUNTER — Ambulatory Visit (INDEPENDENT_AMBULATORY_CARE_PROVIDER_SITE_OTHER): Payer: BC Managed Care – PPO | Admitting: Family Medicine

## 2022-11-01 VITALS — BP 137/87 | HR 81 | Wt 300.6 lb

## 2022-11-01 DIAGNOSIS — F5105 Insomnia due to other mental disorder: Secondary | ICD-10-CM

## 2022-11-01 DIAGNOSIS — F99 Mental disorder, not otherwise specified: Secondary | ICD-10-CM | POA: Diagnosis not present

## 2022-11-01 DIAGNOSIS — F419 Anxiety disorder, unspecified: Secondary | ICD-10-CM | POA: Diagnosis not present

## 2022-11-01 MED ORDER — ALPRAZOLAM 1 MG PO TABS: 1.0000 mg | ORAL_TABLET | Freq: Two times a day (BID) | ORAL | 5 refills | Status: DC | PRN

## 2022-11-01 NOTE — Progress Notes (Signed)
OFFICE VISIT  11/01/2022  CC:  Chief Complaint  Patient presents with   Medication Refill    Medication refill. No other concerns.    Patient is a 47 y.o. male who presents for follow-up anxiety and anxiety-related insomnia.  INTERIM HX: George Lam is doing well. Anxiety and anxiety-related insomnia or stable. He continues to coach several sports.  He has recently started lifting weights at home with his 2 sons.   PMP AWARE reviewed today: most recent rx for alprazolam 1mg  was filled 10/17/22, # 30, rx by me. No red flags.  Past Medical History:  Diagnosis Date   Anxiety    alpraz started about 2011   Fatty liver    u/s 04/2017.  Very mild ALT elevations.   IBS (irritable bowel syndrome)    -D.  Cholestyramine helpful.   Insomnia    Obesity, Class I, BMI 30-34.9    Seasonal allergic rhinitis     Past Surgical History:  Procedure Laterality Date   COLONOSCOPY W/ POLYPECTOMY  04/24/2016   polyps x 2= hyperplastic.  Random colon bx's NEG.  Recall 11 yrs (2028, age 62).    Outpatient Medications Prior to Visit  Medication Sig Dispense Refill   ALPRAZolam (XANAX) 1 MG tablet Take 1 tablet by mouth twice daily as needed for anxiety 30 tablet 0   amitriptyline (ELAVIL) 10 MG tablet TAKE 2 TABLETS BY MOUTH AT BEDTIME. Please keep your June appointment for further refills. 60 tablet 2   albuterol (VENTOLIN HFA) 108 (90 Base) MCG/ACT inhaler Inhale 2 puffs into the lungs every 6 (six) hours as needed for wheezing or shortness of breath. 8 g 1   HYDROcodone bit-homatropine (HYCODAN) 5-1.5 MG/5ML syrup 1-2 tsp po qhs prn cough 120 mL 0   predniSONE (DELTASONE) 10 MG tablet 4 tabs po qd x 3d, then 3 tabs po qd x 3d, then 2 tabs po qd x 3d, then 1 tab po qd x 3d 30 tablet 0   No facility-administered medications prior to visit.    Allergies  Allergen Reactions   Colestid [Colestipol] Other (See Comments)    Hypertriglyceridemia    Review of Systems As per HPI  PE:     11/01/2022    3:35 PM 07/09/2022   10:38 AM 11/27/2021   10:28 AM  Vitals with BMI  Height  6\' 2"  6\' 2"   Weight 300 lbs 10 oz 285 lbs 13 oz 278 lbs 3 oz  BMI  [id number]  Systolic [id number]  Diastolic 87 81 77  Pulse 81 89 75   Physical Exam  Gen: Alert, well appearing.  Patient is oriented to person, place, time, and situation. AFFECT: pleasant, lucid thought and speech. No further exam today  LABS:  Last metabolic panel Lab Results  Component Value Date   GLUCOSE 81 08/24/2020   NA 140 08/24/2020   K 4.5 08/24/2020   CL 103 08/24/2020   CO2 32 08/24/2020   BUN 22 08/24/2020   CREATININE 1.21 08/24/2020   GFRNONAA >60 07/02/2019   CALCIUM 9.8 08/24/2020   PROT 7.4 05/19/2021   ALBUMIN 4.8 05/19/2021   BILITOT 0.9 05/19/2021   ALKPHOS 69 05/19/2021   AST 37 05/19/2021   ALT 84 (H) 05/19/2021   ANIONGAP 11 07/02/2019   Lab Results  Component Value Date   HGBA1C 5.1 07/03/2018   IMPRESSION AND PLAN:  #1 GAD with anxiety-related insomnia. Stable on alprazolam 1 mg twice daily. #60, refill x 5. Additionally, he takes  amitriptyline 10 mg, 2 tablets nightly-->Dr. Havery Moros rx's this for him. He has history of elevated transaminases, hepatic steatosis.  He has follow-up with Dr. Havery Moros this June.  #2 prostate cancer screening: His father was about 3 when he was diagnosed with prostate cancer. Beniah recently got his PSA checked at an outside lab and it was 0.5 per his report. We will start screening him annually (next PSA due around 09/2023).  An After Visit Summary was printed and given to the patient.  FOLLOW UP: No follow-ups on file.  Signed:  Crissie Sickles, MD           11/01/2022

## 2023-01-10 ENCOUNTER — Other Ambulatory Visit (INDEPENDENT_AMBULATORY_CARE_PROVIDER_SITE_OTHER): Payer: BC Managed Care – PPO

## 2023-01-10 ENCOUNTER — Encounter: Payer: Self-pay | Admitting: Gastroenterology

## 2023-01-10 ENCOUNTER — Telehealth: Payer: Self-pay

## 2023-01-10 ENCOUNTER — Other Ambulatory Visit: Payer: Self-pay

## 2023-01-10 ENCOUNTER — Ambulatory Visit (INDEPENDENT_AMBULATORY_CARE_PROVIDER_SITE_OTHER): Payer: BC Managed Care – PPO | Admitting: Gastroenterology

## 2023-01-10 VITALS — BP 124/64 | HR 88 | Ht 72.0 in | Wt 299.4 lb

## 2023-01-10 DIAGNOSIS — K760 Fatty (change of) liver, not elsewhere classified: Secondary | ICD-10-CM

## 2023-01-10 DIAGNOSIS — D582 Other hemoglobinopathies: Secondary | ICD-10-CM

## 2023-01-10 DIAGNOSIS — K580 Irritable bowel syndrome with diarrhea: Secondary | ICD-10-CM | POA: Diagnosis not present

## 2023-01-10 LAB — CBC WITH DIFFERENTIAL/PLATELET
Basophils Absolute: 0.1 10*3/uL (ref 0.0–0.1)
Basophils Relative: 0.9 % (ref 0.0–3.0)
Eosinophils Absolute: 0.2 10*3/uL (ref 0.0–0.7)
Eosinophils Relative: 2.6 % (ref 0.0–5.0)
HCT: 56.9 % — ABNORMAL HIGH (ref 39.0–52.0)
Hemoglobin: 19.3 g/dL (ref 13.0–17.0)
Lymphocytes Relative: 22.7 % (ref 12.0–46.0)
Lymphs Abs: 2.1 10*3/uL (ref 0.7–4.0)
MCHC: 34 g/dL (ref 30.0–36.0)
MCV: 92.1 fl (ref 78.0–100.0)
Monocytes Absolute: 0.7 10*3/uL (ref 0.1–1.0)
Monocytes Relative: 7.7 % (ref 3.0–12.0)
Neutro Abs: 6 10*3/uL (ref 1.4–7.7)
Neutrophils Relative %: 66.1 % (ref 43.0–77.0)
Platelets: 251 10*3/uL (ref 150.0–400.0)
RBC: 6.18 Mil/uL — ABNORMAL HIGH (ref 4.22–5.81)
RDW: 13.9 % (ref 11.5–15.5)
WBC: 9.1 10*3/uL (ref 4.0–10.5)

## 2023-01-10 LAB — HEPATIC FUNCTION PANEL
ALT: 59 U/L — ABNORMAL HIGH (ref 0–53)
AST: 33 U/L (ref 0–37)
Albumin: 4.6 g/dL (ref 3.5–5.2)
Alkaline Phosphatase: 65 U/L (ref 39–117)
Bilirubin, Direct: 0.1 mg/dL (ref 0.0–0.3)
Total Bilirubin: 1 mg/dL (ref 0.2–1.2)
Total Protein: 7.6 g/dL (ref 6.0–8.3)

## 2023-01-10 MED ORDER — AMITRIPTYLINE HCL 10 MG PO TABS: ORAL_TABLET | ORAL | 11 refills | Status: DC

## 2023-01-10 MED ORDER — DICYCLOMINE HCL 10 MG PO CAPS: 10.0000 mg | ORAL_CAPSULE | Freq: Three times a day (TID) | ORAL | 1 refills | Status: DC | PRN

## 2023-01-10 NOTE — Patient Instructions (Addendum)
We have sent the following medications to your pharmacy for you to pick up at your convenience: Elavil Bentyl  Please go to the lab in the basement of our building to have lab work done as you leave today. Hit "B" for basement when you get on the elevator.  When the doors open the lab is on your left.  We will call you with the results. Thank you.  We are referring you to [facility].  They will contact you directly to schedule an appointment.  It may take a week or more before you hear from them.  Please feel free to contact us if you have not heard from them within 2 weeks and we will follow up on the referral.    Thank you for entrusting me with your care and for choosing [facility], Dr. Ileene Patrick    If your blood pressure at your visit was 140/90 or greater, please contact your primary care physician to follow up on this. ______________________________________________________  If you are age 46 or older, your body mass index should be between 23-30. Your Body mass index is 40.6 kg/m. If this is out of the aforementioned range listed, please consider follow up with your Primary Care Provider.  If you are age 62 or younger, your body mass index should be between 19-25. Your Body mass index is 40.6 kg/m. If this is out of the aformentioned range listed, please consider follow up with your Primary Care Provider.  ________________________________________________________  The [place] [facility] providers would like to encourage you to use [facility] to communicate with providers for non-urgent requests or questions.  Due to long hold times on the telephone, sending your provider a message by [facility] may be a faster and more efficient way to get a response.  Please allow 48 business hours for a response.  Please remember that this is for non-urgent requests.  _______________________________________________________  Due to recent changes in healthcare laws, you may see  the results of your imaging and laboratory studies on MyChart before your provider has had a chance to review them.  We understand that in some cases there may be results that are confusing or concerning to you. Not all laboratory results come back in the same time frame and the provider may be waiting for multiple results in order to interpret others.  Please give [place] 48 hours in order for your provider to thoroughly review all the results before contacting the office for clarification of your results.

## 2023-01-10 NOTE — Telephone Encounter (Signed)
The Lab downstairs called with a critical hemoglobin today of 19.3.

## 2023-01-10 NOTE — Telephone Encounter (Signed)
Noted.  See lab result note for details of recommendations.

## 2023-01-10 NOTE — Progress Notes (Signed)
HPI :  47 year old male here for follow-up visit for IBS-D and fatty liver/elevated ALT.  I last saw him in November 2022.  Recall he has had IBS-D suspected for several years now.  Prior colonoscopy in 2017 showed no evidence of microscopic colitis.  Negative labs for celiac disease.  Recall previously he was on Colestid and this worked really well for him however it caused his triglycerides to go up and we stopped the drug.  Viberzi was too strong for him.  He has been on Elavil for the past few years and this has worked better for him.  He is currently taking 20 mg nightly.  He has variable stool frequency anywhere from 2-3 times a day to more or less, with variable formed.  Generally this has minimized fluctuations in symptoms.  He has known food triggers which he tries to avoid.  He does still have occasional bad days with some abdominal cramps.  We discussed other options, he has not tried Bentyl/dicyclomine in the past.  Recall otherwise he has a history of fatty liver disease with negative serologic workup.  He denies any significant or routine alcohol use at baseline.  We have discussed his weight in the past, unfortunately he has gained some weight since her last visit and is up to 299 pounds with a body mass index of 40.6.  He thinks about 10 pounds over the past year.  He tried drinking coffee routinely for some time but has a hard time being compliant with that.  He has had a difficult time losing weight on his own and we discussed options for that.  We performed an elastography in November 2022.  He had steatosis noted, small gallbladder polyps for which no further follow-up was recommended, and median K PA of 5.6 (Low risk for advanced liver disease).  He has not had LFTs drawn since 2022, last ALT was in the 80s.  Of note he was nonimmune to hepatitis A at the last visit and he received vaccination for that.    Colonoscopy - 04/24/16 - normal ileum, normal colon other than 2 small polyps -  hyperplastic, small hemorhoids, biopsies taken - no microscopic colitis    US abdomen 05/29/17 - IMPRESSION: 1. Two very small gallbladder polyps which are unchanged since July, 2017. No further imaging follow-up is felt necessary. 2. Diffuse hepatic steatosis with focal sparing adjacent to the gallbladder. No significant focal hepatic parenchymal abnormalities.    [place] elastography 06/16/21: MPRESSION: ULTRASOUND RUQ:   1. Hepatic steatosis with focal fatty sparing along the gallbladder fossa.   2. Gallbladder polyps measuring up to 5 mm. No further evaluation or follow up necessary based on size criteria. Per consensus guidelines, this requires no additional evaluation or specific follow-up. This recommendation follows ACR consensus guidelines: White Paper of the ACR Incidental findings Committee II on Gallbladder and Biliary Findings. J Am Coll Radiol 2013:;10:953-956.   ULTRASOUND HEPATIC ELASTOGRAPHY:   Median kPa:  5.6   Diagnostic category: < or = 9 kPa: in the absence of other known clinical signs, rules out cACLD   Lab Results  Component Value Date   ALT 84 (H) 05/19/2021   AST 37 05/19/2021   ALKPHOS 69 05/19/2021   BILITOT 0.9 05/19/2021     Past Medical History:  Diagnosis Date   Anxiety    alpraz started about 2011   Fatty liver    u/s 04/2017.  Very mild ALT elevations.   IBS (irritable bowel syndrome)    -  D.  Cholestyramine helpful.   Insomnia    Obesity, Class I, BMI 30-34.9    Seasonal allergic rhinitis      Past Surgical History:  Procedure Laterality Date   COLONOSCOPY W/ POLYPECTOMY  04/24/2016   polyps x 2= hyperplastic.  Random colon bx's NEG.  Recall 11 yrs (2028, age 54).   Family History  Problem Relation Age of Onset   Arthritis Father    Prostate cancer Father    Colon cancer Neg Hx    Stomach cancer Neg Hx    Pancreatic cancer Neg Hx    Esophageal cancer Neg Hx    Social History   Tobacco Use   Smoking status: Never    Smokeless tobacco: Never  Vaping Use   Vaping Use: Never used  Substance Use Topics   Alcohol use: Yes    Alcohol/week: 0.0 standard drinks of alcohol    Comment: OCC   Drug use: No   Current Outpatient Medications  Medication Sig Dispense Refill   ALPRAZolam (XANAX) 1 MG tablet Take 1 tablet (1 mg total) by mouth 2 (two) times daily as needed. for anxiety 60 tablet 5   amitriptyline (ELAVIL) 10 MG tablet TAKE 2 TABLETS BY MOUTH AT BEDTIME. Please keep your June appointment for further refills. 60 tablet 2   No current facility-administered medications for this visit.   Allergies  Allergen Reactions   Colestid [Colestipol] Other (See Comments)    Hypertriglyceridemia     Review of Systems: All systems reviewed and negative except where noted in HPI.   Lab Results  Component Value Date   WBC 5.4 07/02/2019   HGB 17.4 (H) 07/02/2019   HCT 49.5 07/02/2019   MCV 87.6 07/02/2019   PLT 159 07/02/2019    Lab Results  Component Value Date   CREATININE 1.21 08/24/2020   BUN 22 08/24/2020   NA 140 08/24/2020   K 4.5 08/24/2020   CL 103 08/24/2020   CO2 32 08/24/2020    Lab Results  Component Value Date   ALT 84 (H) 05/19/2021   AST 37 05/19/2021   ALKPHOS 69 05/19/2021   BILITOT 0.9 05/19/2021     Physical Exam: BP (!) 122/94 (BP Location: Left Arm, Patient Position: Sitting, Cuff Size: Large)   Pulse 88   Ht 6' (1.829 m)   Wt 299 lb 6 oz (135.8 kg)   BMI 40.60 kg/m  Constitutional: Pleasant,well-developed, male in no acute distress. Neurological: Alert and oriented to person place and time. Psychiatric: Normal mood and affect. Behavior is normal.   ASSESSMENT: 47 y.o. male here for assessment of the following  1. Irritable bowel syndrome with diarrhea   2. Fatty liver    Reviewed his history of IBS D as above.  Generally Elavil has helped him and he wants to continue it, this prevents fluctuations in severity of his disease and generally makes it  milder, he is trying to avoid known food triggers.  Colestid worked really well for him although complicated by high triglyceride levels which led [place] to stop the drug.  We discussed other options.  If he is having a particularly bad day he can try using some Bentyl as needed to slow him down and with abdominal cramps.  I will give him a trial of that to use as needed.  Otherwise he wishes to continue for now.  Due for routine screening colonoscopy in 2027.  We reviewed his history of fatty liver.  Last elastography was reassuring, however  he has gained weight since I have last seen him.  Baseline ALT elevation in the 80s previously.  At risk for fibrosis over time and we discussed long-term risks for cirrhosis etc.  Will repeat his LFTs today to see where ALT is trending.  Also check platelet count to make sure normal.  Counseled him on weight loss, and options for dedicated weight loss center referral.  We discussed this for a bit and he is willing to see them for assistance with weight loss as this has been a struggle for him in recent years.  Referral placed.  Otherwise he will avoid routine use of alcohol and we will trend his ALT over time.  PLAN: - continue Elavil 20mg  q HS - refilled - add Bentyl 10mg  - 1-2 tabs every 8 hours PRN - discussed other options for IBS-D, he declines for now - discussed fatty liver - lab today for CBC and LFTs - counseled on weight loss, referred to [facility] - encouraged routine coffee intake - continue to minimize EtOH intake - follow up one year  Harlin Rain, MD [facility]

## 2023-04-03 IMAGING — US US ABDOMEN LIMITED W/ ELASTOGRAPHY
1 series · 12 of 25 positions shown · non-contrast
Comparison: Ultrasound May 29, 2017

CLINICAL DATA: Hepatic steatosis.

EXAM:
US ABDOMEN LIMITED - RIGHT UPPER QUADRANT
ULTRASOUND HEPATIC ELASTOGRAPHY
TECHNIQUE: Sonography of the right upper quadrant was performed. In addition,
ultrasound elastography evaluation of the liver was performed. A
region of interest was placed within the right lobe of the liver.
Following application of a compressive sonographic pulse, tissue
compressibility was assessed. Multiple assessments were performed at
the selected site. Median tissue compressibility was determined.
Previously, hepatic stiffness was assessed by shear wave velocity.
Based on recently published Society of Radiologists in Ultrasound
consensus article, reporting is now recommended to be performed in
the SI units of pressure (kiloPascals) representing hepatic
stiffness/elasticity. The obtained result is compared to the
published reference standards. (cACLD = compensated Advanced Chronic
Liver Disease)

[Series 1: us abdomen limited w/ elastography · 12 of 61 slices shown]
[im 3/61]
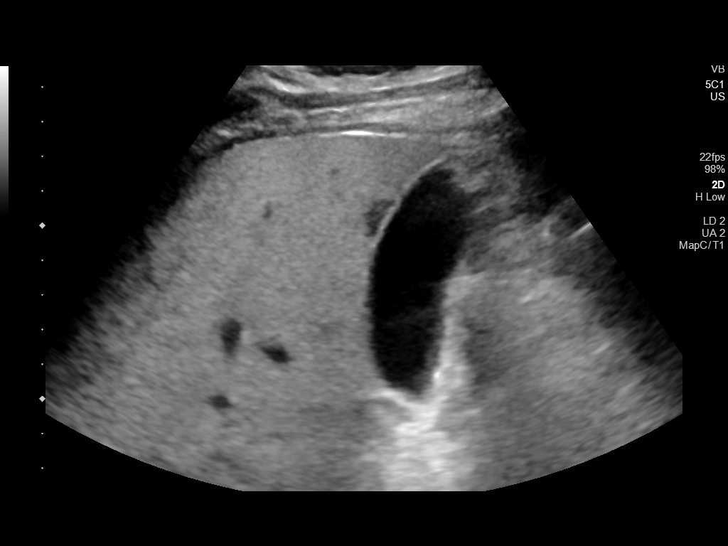
[im 8/61]
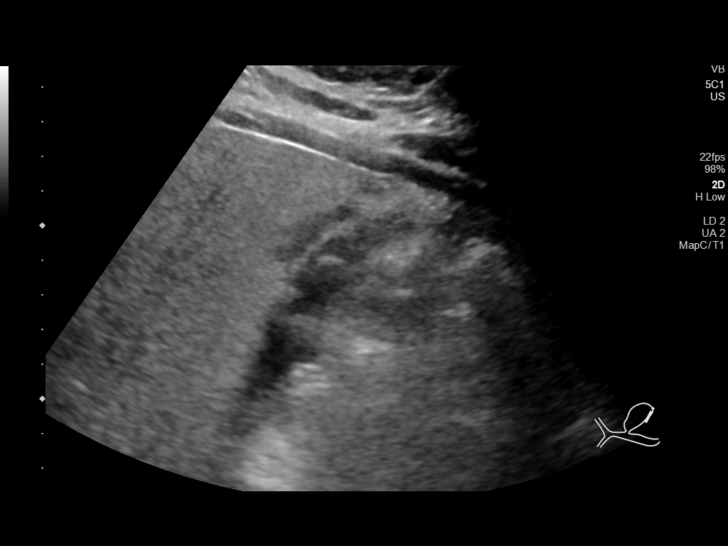
[im 13/61]
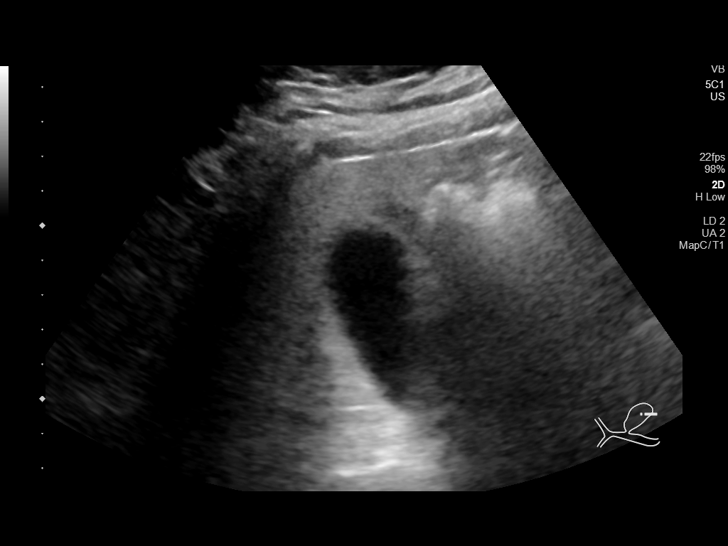
[im 18/61]
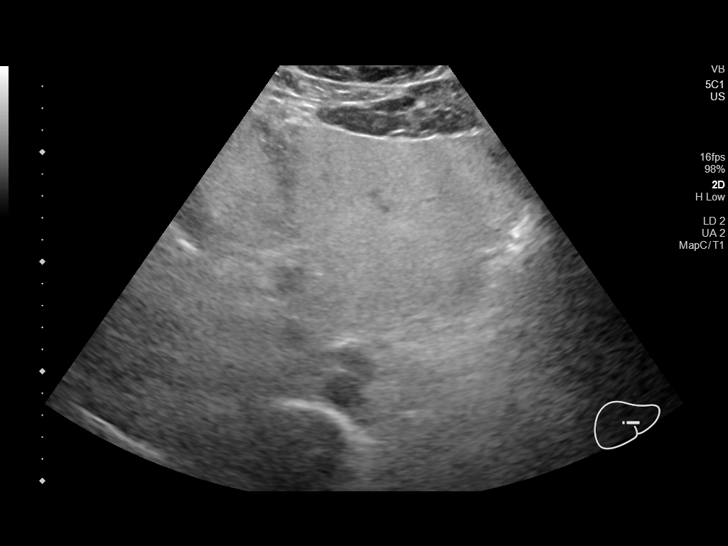
[im 23/61]
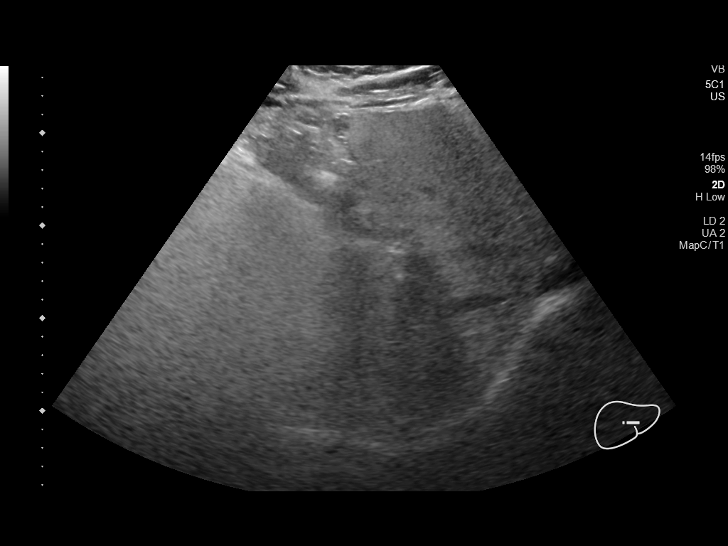
[im 28/61]
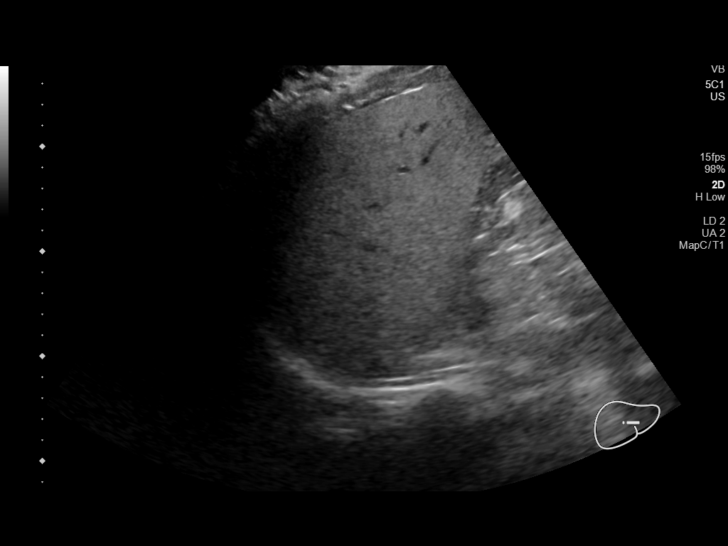
[im 33/61]
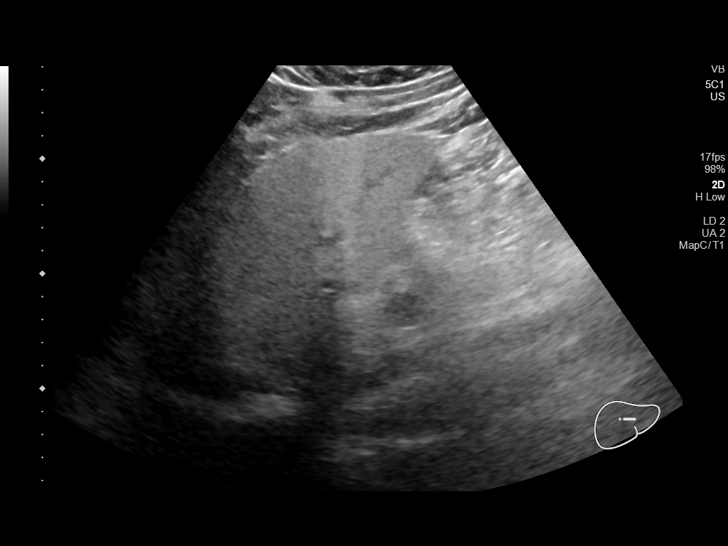
[im 38/61]
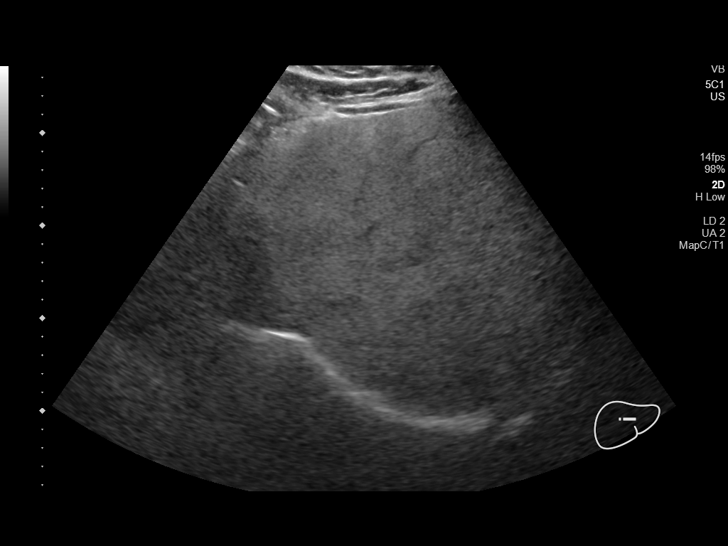
[im 43/61]
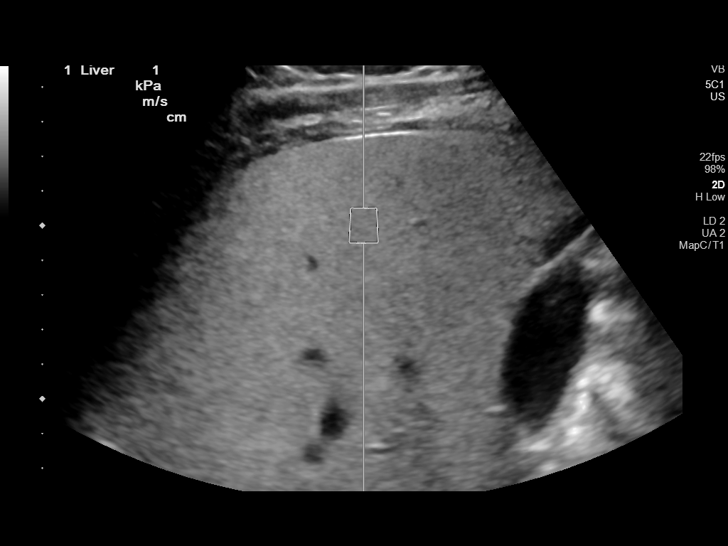
[im 48/61]
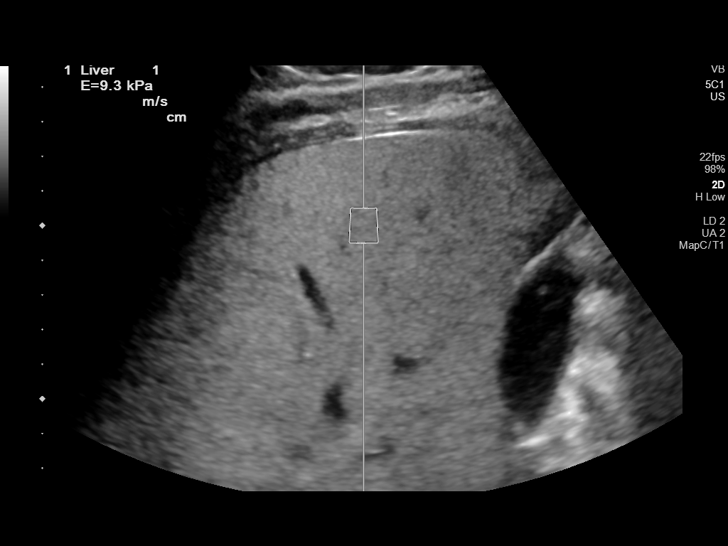
[im 53/61]
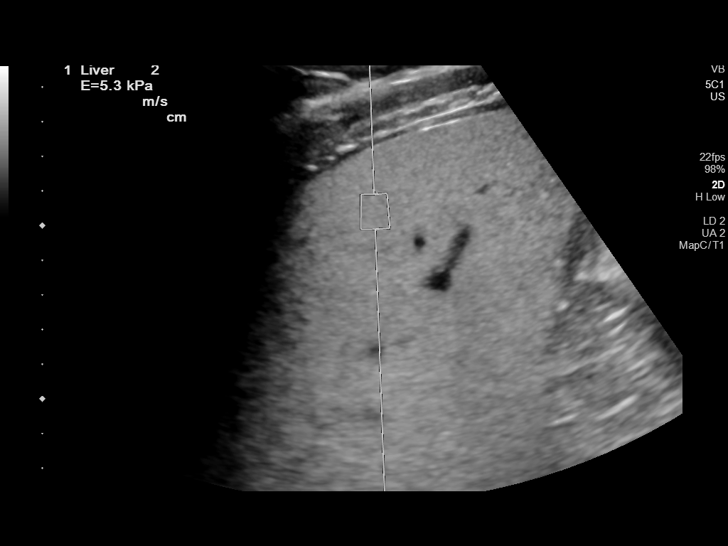
[im 58/61]
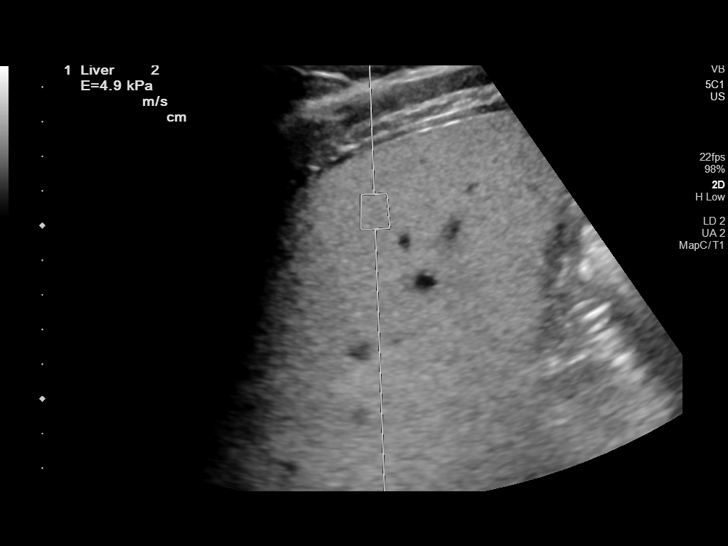

[12 of 25 positions shown; findings below may reference images not displayed]

FINDINGS: ULTRASOUND ABDOMEN LIMITED RIGHT UPPER QUADRANT

Gallbladder:

No gallstones or wall thickening visualized. 2 gallbladder polyps
the largest of which measures 5 mm. No sonographic Murphy sign
noted.

Common bile duct:

Diameter: 5 mm

Liver:

Diffusely increased parenchymal echogenicity with 2.1 cm relatively
hypoechoic area along the gallbladder fossa. Portal vein is patent
on color Doppler imaging with normal direction of blood flow towards
the liver.

ULTRASOUND HEPATIC ELASTOGRAPHY

Device: Siemens Helix VTQ

Patient position: Oblique

Transducer 5C1

Number of measurements: 10

Hepatic segment:  8

Median kPa:

IQR:

IQR/Median kPa ratio:

Data quality:  Good

Diagnostic category: < or = 9 kPa: in the absence of other known
clinical signs, rules out cACLD

The use of hepatic elastography is applicable to patients with viral
hepatitis and non-alcoholic fatty liver disease. At this time, there
is insufficient data for the referenced cut-off values and use in
other causes of liver disease, including alcoholic liver disease.
Patients, however, may be assessed by elastography and serve as
their own reference standard/baseline.

In patients with non-alcoholic liver disease, the values suggesting
compensated advanced chronic liver disease (cACLD) may be lower, and
patients may need additional testing with elasticity results of [DATE]
kPa.

Please note that abnormal hepatic elasticity and shear wave
velocities may also be identified in clinical settings other than
with hepatic fibrosis, such as: acute hepatitis, elevated right
heart and central venous pressures including use of beta blockers,
Knausz disease ([place]), infiltrative processes such as
mastocytosis/amyloidosis/infiltrative tumor/lymphoma, extrahepatic
cholestasis, with hyperemia in the post-prandial state, and with
liver transplantation. Correlation with patient history, laboratory
data, and clinical condition recommended.

Diagnostic Categories:

< or =5 kPa: high probability of being normal

< or =9 kPa: in the absence of other known clinical signs, rules [DATE] kPa and ?13 kPa: suggestive of cACLD, but needs further testing

>13 kPa: highly suggestive of cACLD

> or =17 kPa: highly suggestive of cACLD with an increased
probability of clinically significant portal hypertension
IMPRESSION: ULTRASOUND RUQ:

1. Hepatic steatosis with focal fatty sparing along the gallbladder
fossa.

2. Gallbladder polyps measuring up to 5 mm. No further evaluation or
follow up necessary based on size criteria. Per consensus
guidelines, this requires no additional evaluation or specific
follow-up. This recommendation follows ACR consensus guidelines:
White Paper of the ACR Incidental findings Committee II on
Gallbladder and Biliary Findings. [HOSPITAL] 3970:;[DATE].

ULTRASOUND HEPATIC ELASTOGRAPHY:

Median kPa:

Diagnostic category: < or = 9 kPa: in the absence of other known
clinical signs, rules out cACLD

## 2023-07-03 ENCOUNTER — Telehealth: Payer: Self-pay

## 2023-07-03 DIAGNOSIS — K760 Fatty (change of) liver, not elsewhere classified: Secondary | ICD-10-CM

## 2023-07-03 NOTE — Telephone Encounter (Signed)
-----   Message from [facility] Marylu Lund H sent at 01/10/2023 12:32 PM EDT ----- Regarding: lfts Due for LFTs in mid December

## 2023-07-03 NOTE — Telephone Encounter (Signed)
Order entered.  MyChart message to patient to go to the lab for LFTs

## 2023-07-14 ENCOUNTER — Other Ambulatory Visit: Payer: Self-pay | Admitting: Family Medicine

## 2023-08-05 ENCOUNTER — Encounter: Payer: Self-pay | Admitting: Family Medicine

## 2023-08-06 ENCOUNTER — Ambulatory Visit: Payer: Self-pay | Admitting: Family Medicine

## 2023-08-06 NOTE — Telephone Encounter (Signed)
 Appt scheduled

## 2023-08-06 NOTE — Telephone Encounter (Addendum)
 Copied from CRM [id number]. Topic: Clinical - Red Word Triage >> Aug 06, 2023 10:11 AM Deaijah H wrote: Red Word that prompted transfer to Nurse Triage: Poison / getting worse every day / unsure where it came from   Chief Complaint: Bumps/rash Symptoms: Bumps appearing in multiple areas of body and is spreading, itchiness, and eye itching Frequency: Ongoing for 2 weeks Pertinent Negatives: Patient denies drainage Disposition: [] ED /[] Urgent Care (no appt availability in office) / [x] Appointment(In office/virtual)/ []  [place] [facility]/ [] Home Care/ [] Refused Recommended Disposition /[] [place] [facility]/ []  Follow-up with PCP  Additional Notes: Patient stated he has a spreading rash on his legs, forearms, chest. It is very itchy and he has also experienced itchy eyes the past 2-3 days. Patient has attached pics of rash in mychart. He has tried applying Benadryl cream, poison oak cream, and calamine cream. The creams help a little bit to relieve the itching, but the rash is continuing to spread. Appointment has been scheduled for 1/8.    Reason for Disposition  SEVERE itching (i.e., interferes with sleep, normal activities or school)  Answer Assessment - Initial Assessment Questions 1. APPEARANCE of RASH: Describe the rash. (e.g., spots, blisters, raised areas, skin peeling, scaly)     Bumps that are spreading  2. LOCATION: Where is the rash located?     Multiple areas of body (legs, behind knees, arms, chest  3. COLOR: What color is the rash? (Note: It is difficult to assess rash color in people with darker-colored skin. When this situation occurs, simply ask the caller to describe what they see.)     Redness  4. ONSET: When did the rash begin?     2 weeks ago  5. FEVER: Do you have a fever? If Yes, ask: What is your temperature, how was it measured, and when did it start?     No  6. ITCHING: Does the rash itch? If Yes, ask: How bad is the itch? (Scale 1-10; or  mild, moderate, severe)     Moderate-severe  7. CAUSE: What do you think is causing the rash?     Unknown, patient stated the bumps look like reaction to poison. However, patient has not had any recent exposure to anything and he denies allergies.  8. MEDICINE FACTORS: Have you started any new medicines within the last 2 weeks? (e.g., antibiotics)      No new medications  9. OTHER SYMPTOMS: Do you have any other symptoms? (e.g., dizziness, headache, sore throat, joint pain)       Eye itching  Protocols used: Rash or Redness - [facility]

## 2023-08-07 ENCOUNTER — Encounter: Payer: Self-pay | Admitting: Family Medicine

## 2023-08-07 ENCOUNTER — Ambulatory Visit (INDEPENDENT_AMBULATORY_CARE_PROVIDER_SITE_OTHER): Payer: BC Managed Care – PPO | Admitting: Family Medicine

## 2023-08-07 VITALS — BP 134/88 | HR 74 | Wt 291.0 lb

## 2023-08-07 DIAGNOSIS — K7581 Nonalcoholic steatohepatitis (NASH): Secondary | ICD-10-CM

## 2023-08-07 DIAGNOSIS — R21 Rash and other nonspecific skin eruption: Secondary | ICD-10-CM

## 2023-08-07 DIAGNOSIS — L237 Allergic contact dermatitis due to plants, except food: Secondary | ICD-10-CM

## 2023-08-07 DIAGNOSIS — Z79899 Other long term (current) drug therapy: Secondary | ICD-10-CM | POA: Diagnosis not present

## 2023-08-07 DIAGNOSIS — D751 Secondary polycythemia: Secondary | ICD-10-CM

## 2023-08-07 DIAGNOSIS — F411 Generalized anxiety disorder: Secondary | ICD-10-CM

## 2023-08-07 MED ORDER — METHYLPREDNISOLONE ACETATE 80 MG/ML IJ SUSP
80.0000 mg | Freq: Once | INTRAMUSCULAR | Status: AC
Start: 2023-08-07 — End: 2023-08-07
  Administered 2023-08-07: 80 mg via INTRAMUSCULAR

## 2023-08-07 MED ORDER — PREDNISONE 10 MG PO TABS: ORAL_TABLET | ORAL | 0 refills | Status: DC

## 2023-08-07 MED ORDER — ALPRAZOLAM 1 MG PO TABS: 1.0000 mg | ORAL_TABLET | Freq: Two times a day (BID) | ORAL | 5 refills | Status: DC | PRN

## 2023-08-07 NOTE — Progress Notes (Signed)
 OFFICE VISIT  08/07/2023  CC:  Chief Complaint  Patient presents with   Rash    Both arms, knees; week and half.  Started to itch before pt noticed any rash. Taken and applied Benadryl.      Patient is a 48 y.o. male who presents for a rash as well as f/u anxiety with high risk med use.  HPI: Doing well/all stable from an anxiety standpoint. Alprz 1-2 times a day.  No panic. No depression.   Note, his gastroenterologist referred him to hematology because of a an elevated hemoglobin back in June--> 19.3. George Lam stated he went to this appt but was told nothing was wrong.  He started getting an itchy rash on arms about 2 wks ago when at the beach. He does regularly work in woods in back of his house, also has a dog that runs through there all the time and comes in and sleeps in his bed. Now has the rash on lower legs and L hip region. Itching keeping him up at night. Topical benadryl and hot showers minimally helpful No f/c/malaise or joint aches.  PMP AWARE reviewed today: most recent rx for alprazolam  was filled 07/15/2023, # 60, rx by me. No red flags.   Past Medical History:  Diagnosis Date   Anxiety    alpraz started about 2011   Fatty liver    u/s 04/2017.  Very mild ALT elevations.   IBS (irritable bowel syndrome)    -D.  Cholestyramine helpful.   Insomnia    Obesity, Class I, BMI 30-34.9    Seasonal allergic rhinitis     Past Surgical History:  Procedure Laterality Date   COLONOSCOPY W/ POLYPECTOMY  04/24/2016   polyps x 2= hyperplastic.  Random colon bx's NEG.  Recall 11 yrs (2028, age 42).    Outpatient Medications Prior to Visit  Medication Sig Dispense Refill   amitriptyline  (ELAVIL ) 10 MG tablet TAKE 2 TABLETS BY MOUTH AT BEDTIME. Please keep your June appointment for further refills. 60 tablet 11   dicyclomine  (BENTYL ) 10 MG capsule Take 1-2 capsules (10-20 mg total) by mouth every 8 (eight) hours as needed for spasms. 60 capsule 1   ALPRAZolam  (XANAX )  1 MG tablet Take 1 tablet by mouth twice daily as needed for anxiety 60 tablet 0   No facility-administered medications prior to visit.    Allergies  Allergen Reactions   Colestid  [Colestipol ] Other (See Comments)    Hypertriglyceridemia    Review of Systems  As per HPI  PE:    08/07/2023    9:00 AM 01/10/2023    9:02 AM 01/10/2023    8:21 AM  Vitals with BMI  Height   6' 0  Weight 291 lbs  299 lbs 6 oz  BMI   40.59  Systolic 134 124 877  Diastolic 88 64 94  Pulse 74  88     Physical Exam  Gen: Alert, well appearing.  Patient is oriented to person, place, time, and situation. AFFECT: pleasant, lucid thought and speech. Small scattered patches of pinkish papular lesions w/out erythema. No pustules or vesicles or petechiae.  No hives.  LABS:   Lab Results  Component Value Date   WBC 9.1 01/10/2023   HGB 19.3 Repeated and verified X2. (HH) 01/10/2023   HCT 56.9 (H) 01/10/2023   MCV 92.1 01/10/2023   PLT 251.0 01/10/2023   Lab Results  Component Value Date   IRON 61 02/20/2016   FERRITIN 245.4 02/20/2016  Last metabolic panel Lab Results  Component Value Date   GLUCOSE 81 08/24/2020   NA 140 08/24/2020   K 4.5 08/24/2020   CL 103 08/24/2020   CO2 32 08/24/2020   BUN 22 08/24/2020   CREATININE 1.21 08/24/2020   GFR 73.40 08/24/2020   CALCIUM 9.8 08/24/2020   PROT 7.6 01/10/2023   ALBUMIN 4.6 01/10/2023   BILITOT 1.0 01/10/2023   ALKPHOS 65 01/10/2023   AST 33 01/10/2023   ALT 59 (H) 01/10/2023   ANIONGAP 11 07/02/2019   Lab Results  Component Value Date   CHOL 214 (H) 08/24/2020   HDL 39.20 08/24/2020   LDLCALC 145 (H) 08/24/2020   LDLDIRECT 111.0 07/03/2018   TRIG 150.0 (H) 08/24/2020   CHOLHDL 5 08/24/2020   IMPRESSION AND PLAN:  1) Allergic contact dermatitis d/t poison ivy. Depo medrol  80mg  IM here today. Tomorrow start prednisone  taper: 40 x 3, 30 x 3, 20 x3, 10 x 3.  2) GAD, doing well long term on alprazolam  1mg  bid prn. New rx  #60 with RF x 5.  3) NASH, followed by GI. He'll be getting monitoring labs via GI MD orders soon.  4) Erythrocytosis, unknown etiology. Need to consider dx of hemachromatosis in light of pt's hx elev LFTs and fatty liver changes on multiple ultrasounds. Reviewing EMR it appears that the referral from 01/10/23 was eventually closed b/c pt did not return messages to schedule appt. We''ll f/u on this medical problem when I see him again for cpe in 6 mo.  An After Visit Summary was printed and given to the patient.  FOLLOW UP: Return in about 6 months (around 02/04/2024) for annual CPE (fasting).  Signed:  Gerlene Hockey, MD           08/07/2023

## 2023-08-07 NOTE — Patient Instructions (Signed)
 You were given a strong steroid injection today.  Do not start your prednisone tabs until tomorrow

## 2023-08-31 ENCOUNTER — Emergency Department (HOSPITAL_BASED_OUTPATIENT_CLINIC_OR_DEPARTMENT_OTHER): Payer: BC Managed Care – PPO

## 2023-08-31 ENCOUNTER — Other Ambulatory Visit: Payer: Self-pay

## 2023-08-31 ENCOUNTER — Encounter (HOSPITAL_BASED_OUTPATIENT_CLINIC_OR_DEPARTMENT_OTHER): Payer: Self-pay | Admitting: Emergency Medicine

## 2023-08-31 ENCOUNTER — Emergency Department (HOSPITAL_BASED_OUTPATIENT_CLINIC_OR_DEPARTMENT_OTHER)
Admission: EM | Admit: 2023-08-31 | Discharge: 2023-08-31 | Disposition: A | Discharge status: Home / Self Care | Payer: BC Managed Care – PPO | Attending: Emergency Medicine | Admitting: Emergency Medicine

## 2023-08-31 DIAGNOSIS — J09X2 Influenza due to identified novel influenza A virus with other respiratory manifestations: Secondary | ICD-10-CM | POA: Diagnosis not present

## 2023-08-31 DIAGNOSIS — J101 Influenza due to other identified influenza virus with other respiratory manifestations: Secondary | ICD-10-CM | POA: Diagnosis not present

## 2023-08-31 DIAGNOSIS — R1084 Generalized abdominal pain: Secondary | ICD-10-CM | POA: Insufficient documentation

## 2023-08-31 DIAGNOSIS — Z20822 Contact with and (suspected) exposure to covid-19: Secondary | ICD-10-CM | POA: Diagnosis not present

## 2023-08-31 DIAGNOSIS — R059 Cough, unspecified: Secondary | ICD-10-CM | POA: Diagnosis not present

## 2023-08-31 LAB — CBC WITH DIFFERENTIAL/PLATELET
Abs Immature Granulocytes: 0.03 10*3/uL (ref 0.00–0.07)
Basophils Absolute: 0.1 10*3/uL (ref 0.0–0.1)
Basophils Relative: 1 %
Eosinophils Absolute: 0.1 10*3/uL (ref 0.0–0.5)
Eosinophils Relative: 1 %
HCT: 50.2 % (ref 39.0–52.0)
Hemoglobin: 17.3 g/dL — ABNORMAL HIGH (ref 13.0–17.0)
Immature Granulocytes: 0 %
Lymphocytes Relative: 23 %
Lymphs Abs: 1.5 10*3/uL (ref 0.7–4.0)
MCH: 31.3 pg (ref 26.0–34.0)
MCHC: 34.5 g/dL (ref 30.0–36.0)
MCV: 90.8 fL (ref 80.0–100.0)
Monocytes Absolute: 1.1 10*3/uL — ABNORMAL HIGH (ref 0.1–1.0)
Monocytes Relative: 16 %
Neutro Abs: 3.9 10*3/uL (ref 1.7–7.7)
Neutrophils Relative %: 59 %
Platelets: 214 10*3/uL (ref 150–400)
RBC: 5.53 MIL/uL (ref 4.22–5.81)
RDW: 14.1 % (ref 11.5–15.5)
WBC: 6.7 10*3/uL (ref 4.0–10.5)
nRBC: 0 % (ref 0.0–0.2)

## 2023-08-31 LAB — LIPASE, BLOOD: Lipase: 34 U/L (ref 11–51)

## 2023-08-31 LAB — COMPREHENSIVE METABOLIC PANEL
ALT: 64 U/L — ABNORMAL HIGH (ref 0–44)
AST: 35 U/L (ref 15–41)
Albumin: 4.5 g/dL (ref 3.5–5.0)
Alkaline Phosphatase: 60 U/L (ref 38–126)
Anion gap: 9 (ref 5–15)
BUN: 14 mg/dL (ref 6–20)
CO2: 28 mmol/L (ref 22–32)
Calcium: 9.1 mg/dL (ref 8.9–10.3)
Chloride: 99 mmol/L (ref 98–111)
Creatinine, Ser: 1.24 mg/dL (ref 0.61–1.24)
GFR, Estimated: >60 mL/min (ref >60)
Glucose, Bld: 111 mg/dL — ABNORMAL HIGH (ref 70–99)
Potassium: 4.4 mmol/L (ref 3.5–5.1)
Sodium: 136 mmol/L (ref 135–145)
Total Bilirubin: 0.9 mg/dL (ref 0.0–1.2)
Total Protein: 8.3 g/dL — ABNORMAL HIGH (ref 6.5–8.1)

## 2023-08-31 LAB — RESP PANEL BY RT-PCR (RSV, FLU A&B, COVID)  RVPGX2
Influenza A by PCR: POSITIVE — AB
Influenza B by PCR: NEGATIVE
Resp Syncytial Virus by PCR: NEGATIVE
SARS Coronavirus 2 by RT PCR: NEGATIVE

## 2023-08-31 MED ORDER — IBUPROFEN 800 MG PO TABS
800.0000 mg | ORAL_TABLET | Freq: Once | ORAL | Status: AC
  Administered 2023-08-31: 800 mg via ORAL
  Filled 2023-08-31: qty 1

## 2023-08-31 NOTE — ED Provider Notes (Signed)
[place] [facility] Provider Note   CSN: [id number] Arrival date & time: 08/31/23  1332     History  Chief Complaint  Patient presents with   Fever    Kacin Dancy is a 48 y.o. male.  Patient here with 4-day history of flulike illness.  Patient had temp at home up to 104 generalized abdominal pain diarrhea generalized bodyaches.  No nausea or vomiting.  Abdominal pain is on both side worse with coughing.  Patient states she has had multiple exposure to folks with the flu.  Past medical history sniffing for seasonal allergies irritable bowel syndrome.  Patient is never used tobacco products.       Home Medications Prior to Admission medications   Medication Sig Start Date End Date Taking? Authorizing Provider  ALPRAZolam Prudy Feeler) 1 MG tablet Take 1 tablet (1 mg total) by mouth 2 (two) times daily as needed. for anxiety 08/07/23   McGowen, Maryjean Morn, MD  amitriptyline (ELAVIL) 10 MG tablet TAKE 2 TABLETS BY MOUTH AT BEDTIME. Please keep your June appointment for further refills. 01/10/23   Armbruster, Willaim Rayas, MD  dicyclomine (BENTYL) 10 MG capsule Take 1-2 capsules (10-20 mg total) by mouth every 8 (eight) hours as needed for spasms. 01/10/23   Armbruster, Willaim Rayas, MD  predniSONE (DELTASONE) 10 MG tablet 4 tabs every day x 3d then 3 tabs every day x 3d then 2 tabs every day x 3d then 1 tab every day x 3d 08/07/23   McGowen, Maryjean Morn, MD      Allergies    Colestid [colestipol]    Review of Systems   Review of Systems  Constitutional:  Positive for fever. Negative for chills.  HENT:  Negative for ear pain and sore throat.   Eyes:  Negative for pain and visual disturbance.  Respiratory:  Positive for cough. Negative for shortness of breath.   Cardiovascular:  Negative for chest pain and palpitations.  Gastrointestinal:  Positive for abdominal pain and diarrhea. Negative for nausea and vomiting.  Genitourinary:  Negative for dysuria and  hematuria.  Musculoskeletal:  Positive for myalgias. Negative for arthralgias and back pain.  Skin:  Negative for color change and rash.  Neurological:  Negative for seizures and syncope.  All other systems reviewed and are negative.   Physical Exam Updated Vital Signs BP 125/88   Pulse (!) 113   Temp 99.4 F (37.4 C) (Oral)   Resp 20   Ht 1.854 m (6\' 1" )   Wt 131.5 kg   SpO2 96%   BMI 38.26 kg/m  Physical Exam Vitals and nursing note reviewed.  Constitutional:      General: He is not in acute distress.    Appearance: Normal appearance. He is well-developed. He is not ill-appearing.  HENT:     Head: Normocephalic and atraumatic.     Mouth/Throat:     Mouth: Mucous membranes are moist.     Pharynx: No oropharyngeal exudate or posterior oropharyngeal erythema.  Eyes:     Conjunctiva/sclera: Conjunctivae normal.  Cardiovascular:     Rate and Rhythm: Normal rate and regular rhythm.     Heart sounds: No murmur heard. Pulmonary:     Effort: Pulmonary effort is normal. No respiratory distress.     Breath sounds: Normal breath sounds. No wheezing, rhonchi or rales.  Abdominal:     Palpations: Abdomen is soft.     Tenderness: There is no abdominal tenderness.  Musculoskeletal:  General: No swelling.     Cervical back: Neck supple.  Skin:    General: Skin is warm and dry.     Capillary Refill: Capillary refill takes less than 2 seconds.  Neurological:     General: No focal deficit present.     Mental Status: He is alert and oriented to person, place, and time.  Psychiatric:        Mood and Affect: Mood normal.     ED Results / Procedures / Treatments   Labs (all labs ordered are listed, but only abnormal results are displayed) Labs Reviewed  RESP PANEL BY RT-PCR (RSV, FLU A&B, COVID)  RVPGX2 - Abnormal; Notable for the following components:      Result Value   Influenza A by PCR POSITIVE (*)    All other components within normal limits  CBC WITH  DIFFERENTIAL/PLATELET - Abnormal; Notable for the following components:   Hemoglobin 17.3 (*)    Monocytes Absolute 1.1 (*)    All other components within normal limits  COMPREHENSIVE METABOLIC PANEL - Abnormal; Notable for the following components:   Glucose, Bld 111 (*)    Total Protein 8.3 (*)    ALT 64 (*)    All other components within normal limits  LIPASE, BLOOD    EKG None  Radiology DG Chest 2 View Result Date: 08/31/2023 CLINICAL DATA:  Cough. EXAM: CHEST - 2 VIEW COMPARISON:  July 09, 2022. FINDINGS: The heart size and mediastinal contours are within normal limits. Both lungs are clear. The visualized skeletal structures are unremarkable. IMPRESSION: No active cardiopulmonary disease. Electronically Signed   By: Lupita Raider M.D.   On: 08/31/2023 14:38    Procedures Procedures    Medications Ordered in ED Medications  ibuprofen (ADVIL) tablet 800 mg (800 mg Oral Given 08/31/23 1418)    ED Course/ Medical Decision Making/ A&P                                 Medical Decision Making Amount and/or Complexity of Data Reviewed Labs: ordered. Radiology: ordered.  Risk Prescription drug management.   Patient's chest x-ray negative.  CBC without leukocytosis.  Complete metabolic panel renal functions normal.  LFTs are normal.  Lipase is normal.  Respiratory panel positive for influenza A.  Patient nontoxic no acute distress will treat symptomatically.  Patient states she does not need a work note. Final Clinical Impression(s) / ED Diagnoses Final diagnoses:  Influenza A    Rx / DC Orders ED Discharge Orders     None         Vanetta Mulders, MD 08/31/23 1622

## 2023-08-31 NOTE — Discharge Instructions (Signed)
Return for any new or worse symptoms to include struggling with breathing.  Today's chest x-ray is negative no signs of pneumonia.  Testing here today was positive for influenza A.  Recommend over-the-counter cold and flu medicines.

## 2023-08-31 NOTE — ED Triage Notes (Signed)
Intermittent subjective fevers (Highest at home 104F), generalized abdominal pain, diarrhea, and generalized body aches x 4 days. Denies n/v. Reports abdominal pain is at BL sides and worse with coughing. Reports known exposure to multiple sick persons.

## 2024-01-28 ENCOUNTER — Other Ambulatory Visit: Payer: Self-pay | Admitting: Gastroenterology

## 2024-02-21 ENCOUNTER — Other Ambulatory Visit: Payer: Self-pay | Admitting: Family Medicine

## 2024-02-21 NOTE — Telephone Encounter (Signed)
#  60 alprazolam  sent, no refill. Needs follow-up.

## 2024-02-21 NOTE — Telephone Encounter (Signed)
 Requesting: ALPRAZolam  (XANAX ) 1 MG tablet  Contract: 11/24/21 UDS: 11/27/21 Last Visit: 08/07/23 Next Visit: advised to f/u 6 mo Last Refill: 08/07/23 (60,5)  Please Advise. Rx pending

## 2024-02-23 ENCOUNTER — Other Ambulatory Visit: Payer: Self-pay | Admitting: Gastroenterology

## 2024-02-24 ENCOUNTER — Other Ambulatory Visit: Payer: Self-pay

## 2024-02-24 DIAGNOSIS — R7989 Other specified abnormal findings of blood chemistry: Secondary | ICD-10-CM

## 2024-02-24 DIAGNOSIS — K760 Fatty (change of) liver, not elsewhere classified: Secondary | ICD-10-CM

## 2024-03-05 ENCOUNTER — Ambulatory Visit (INDEPENDENT_AMBULATORY_CARE_PROVIDER_SITE_OTHER)

## 2024-03-05 ENCOUNTER — Ambulatory Visit: Admitting: Podiatry

## 2024-03-05 ENCOUNTER — Encounter: Payer: Self-pay | Admitting: Podiatry

## 2024-03-05 ENCOUNTER — Telehealth: Payer: Self-pay | Admitting: *Deleted

## 2024-03-05 DIAGNOSIS — M79672 Pain in left foot: Secondary | ICD-10-CM | POA: Diagnosis not present

## 2024-03-05 DIAGNOSIS — M19072 Primary osteoarthritis, left ankle and foot: Secondary | ICD-10-CM | POA: Diagnosis not present

## 2024-03-05 DIAGNOSIS — M722 Plantar fascial fibromatosis: Secondary | ICD-10-CM

## 2024-03-05 MED ORDER — MELOXICAM 15 MG PO TABS: 15.0000 mg | ORAL_TABLET | Freq: Every day | ORAL | 0 refills | Status: DC

## 2024-03-05 NOTE — Patient Instructions (Signed)

## 2024-03-05 NOTE — Telephone Encounter (Signed)
 I called and asked him to come in a few minutes early because he will need xrays prior to seeing Dr. Sikora.  We will direct him to imaging upon his arrival.

## 2024-03-05 NOTE — Progress Notes (Signed)
  Subjective:  Patient ID: George Lam, male    DOB: 03/06/1976,   MRN: [id number]  Chief Complaint  Patient presents with   Foot Pain    I have a problem with my left foot in the heel area.  It's so sore and painful.  ( Plantar)    48 y.o. male presents for concern of left heel pain that has been ongoing for a few months. Relates he has had trouble running like he normally does. Relates pain with first steps after rest. Has tried different shoes but no medications recently. He has had issues in the past with his feet and had PF about 10-15 years ago.   Denies any other pedal complaints. Denies n/v/f/c.   Past Medical History:  Diagnosis Date   Anxiety    alpraz started about 2011   Fatty liver    u/s 04/2017.  Very mild ALT elevations.   IBS (irritable bowel syndrome)    -D.  Cholestyramine helpful.   Insomnia    Obesity, Class I, BMI 30-34.9    Seasonal allergic rhinitis     Objective:  Physical Exam: Vascular: DP/PT pulses 2/4 bilateral. CFT <3 seconds. Normal hair growth on digits. No edema.  Skin. No lacerations or abrasions bilateral feet.  Musculoskeletal: MMT 5/5 bilateral lower extremities in DF, PF, Inversion and Eversion. Deceased ROM in DF of ankle joint. Tender to the medial calcaneal tubercle left . No pain with achilles, PT or arch. No pain with calcaneal squeeze.  Neurological: Sensation intact to light touch.   Assessment:   1. Plantar fasciitis of left foot      Plan:  Patient was evaluated and treated and all questions answered. Discussed plantar fasciitis with patient.  X-rays reviewed and discussed with patient. No acute fractures or dislocations noted. Mild spurring noted at inferior calcaneus. Mild pes cavbus  Discussed treatment options including, ice, NSAIDS, supportive shoes, bracing, and stretching. Stretching exercises provided to be done on a daily basis.   Prescription for meloxicam  provided and sent to pharmacy. Previous kidney function  labs wnl.  PF brace dispensed.  Follow-up 6 weeks or sooner if any problems arise. In the meantime, encouraged to call the office with any questions, concerns, change in symptoms.      Asberry Failing, DPM

## 2024-04-16 ENCOUNTER — Encounter: Payer: Self-pay | Admitting: Podiatry

## 2024-04-16 ENCOUNTER — Ambulatory Visit (INDEPENDENT_AMBULATORY_CARE_PROVIDER_SITE_OTHER): Admitting: Podiatry

## 2024-04-16 DIAGNOSIS — M722 Plantar fascial fibromatosis: Secondary | ICD-10-CM

## 2024-04-16 MED ORDER — DEXAMETHASONE SODIUM PHOSPHATE 120 MG/30ML IJ SOLN
4.0000 mg | Freq: Once | INTRAMUSCULAR | Status: AC
  Administered 2024-04-16: 4 mg via INTRA_ARTICULAR

## 2024-04-16 MED ORDER — TRIAMCINOLONE ACETONIDE 10 MG/ML IJ SUSP
2.5000 mg | Freq: Once | INTRAMUSCULAR | Status: AC
  Administered 2024-04-16: 2.5 mg via INTRA_ARTICULAR

## 2024-04-16 MED ORDER — DICLOFENAC SODIUM 75 MG PO TBEC: 75.0000 mg | DELAYED_RELEASE_TABLET | Freq: Two times a day (BID) | ORAL | 0 refills | Status: AC

## 2024-04-16 NOTE — Progress Notes (Signed)
  Subjective:  Patient ID: George Lam, male    DOB: 04-Aug-1975,   MRN: [id number]  Chief Complaint  Patient presents with   Plantar Fasciitis    I have seen a little improvement but it is still pretty painful.  I can tell a difference when I wear this brace though.    48 y.o. male presents for follow-up of left plantar fasciitis. Relates a little better but still pretty sore. He has been stretching some and wearing brace. Relates still painful in the morning but throughout the day about 70% better. Denies any other pedal complaints. Denies n/v/f/c.   Past Medical History:  Diagnosis Date   Anxiety    alpraz started about 2011   Fatty liver    u/s 04/2017.  Very mild ALT elevations.   IBS (irritable bowel syndrome)    -D.  Cholestyramine helpful.   Insomnia    Obesity, Class I, BMI 30-34.9    Seasonal allergic rhinitis     Objective:  Physical Exam: Vascular: DP/PT pulses 2/4 bilateral. CFT <3 seconds. Normal hair growth on digits. No edema.  Skin. No lacerations or abrasions bilateral feet.  Musculoskeletal: MMT 5/5 bilateral lower extremities in DF, PF, Inversion and Eversion. Deceased ROM in DF of ankle joint. Tender to the medial calcaneal tubercle left . No pain with achilles, PT or arch. No pain with calcaneal squeeze.  Neurological: Sensation intact to light touch.   Assessment:   1. Plantar fasciitis of left foot       Plan:  Patient was evaluated and treated and all questions answered. Discussed plantar fasciitis with patient.  X-rays reviewed and discussed with patient. No acute fractures or dislocations noted. Mild spurring noted at inferior calcaneus. Mild pes cavbus  Discussed treatment options including, ice, NSAIDS, supportive shoes, bracing, and stretching. Amb ref to PT provided  Injection provided as below  Will try diclofenac  instead to see if more response.   Follow-up 6 weeks or sooner if any problems arise. In the meantime, encouraged to call  the office with any questions, concerns, change in symptoms.      Asberry Failing, DPM

## 2024-04-21 ENCOUNTER — Telehealth: Payer: Self-pay | Admitting: Gastroenterology

## 2024-04-21 ENCOUNTER — Other Ambulatory Visit: Payer: Self-pay

## 2024-04-21 MED ORDER — AMITRIPTYLINE HCL 10 MG PO TABS: 20.0000 mg | ORAL_TABLET | Freq: Every day | ORAL | 2 refills | Status: DC

## 2024-04-21 NOTE — Telephone Encounter (Signed)
 Inbound call from patient requesting to be sent temporary refills until he is able to be seen. Patient has been scheduled for 12/3. Please advise.

## 2024-04-21 NOTE — Telephone Encounter (Signed)
 Refills of Elavil  sent to pharmacy to get to Dec appointment

## 2024-05-21 ENCOUNTER — Ambulatory Visit: Admitting: Rehabilitative and Restorative Service Providers"

## 2024-05-28 ENCOUNTER — Encounter: Payer: Self-pay | Admitting: Gastroenterology

## 2024-06-03 ENCOUNTER — Other Ambulatory Visit (INDEPENDENT_AMBULATORY_CARE_PROVIDER_SITE_OTHER)

## 2024-06-03 DIAGNOSIS — K760 Fatty (change of) liver, not elsewhere classified: Secondary | ICD-10-CM

## 2024-06-03 DIAGNOSIS — R7989 Other specified abnormal findings of blood chemistry: Secondary | ICD-10-CM

## 2024-06-03 LAB — HEPATIC FUNCTION PANEL
ALT: 108 U/L — ABNORMAL HIGH (ref 0–53)
AST: 50 U/L — ABNORMAL HIGH (ref 0–37)
Albumin: 4.7 g/dL (ref 3.5–5.2)
Alkaline Phosphatase: 79 U/L (ref 39–117)
Bilirubin, Direct: 0.1 mg/dL (ref 0.0–0.3)
Total Bilirubin: 0.8 mg/dL (ref 0.2–1.2)
Total Protein: 7.4 g/dL (ref 6.0–8.3)

## 2024-06-04 ENCOUNTER — Ambulatory Visit: Payer: Self-pay | Admitting: Gastroenterology

## 2024-06-04 DIAGNOSIS — K760 Fatty (change of) liver, not elsewhere classified: Secondary | ICD-10-CM

## 2024-06-04 DIAGNOSIS — R7989 Other specified abnormal findings of blood chemistry: Secondary | ICD-10-CM

## 2024-07-01 ENCOUNTER — Ambulatory Visit: Admitting: Gastroenterology

## 2024-07-02 ENCOUNTER — Ambulatory Visit: Admitting: Podiatry

## 2024-07-02 NOTE — Telephone Encounter (Signed)
-----   Message from [facility] Clarita H sent at 06/04/2024  1:49 PM EST ----- Regarding: due for labs in Dec Due for LFTs in early Dec - one day next week

## 2024-07-02 NOTE — Telephone Encounter (Signed)
My Chart message to pt

## 2024-07-06 NOTE — Telephone Encounter (Signed)
 Called and spoke to patient.  He will go to the lab in the next week.  Confirmed appointment with Dr. Leigh in [place]

## 2024-07-13 ENCOUNTER — Other Ambulatory Visit: Payer: Self-pay | Admitting: Gastroenterology

## 2024-08-11 ENCOUNTER — Telehealth: Payer: Self-pay

## 2024-08-11 ENCOUNTER — Other Ambulatory Visit (HOSPITAL_COMMUNITY): Payer: Self-pay

## 2024-08-11 ENCOUNTER — Ambulatory Visit: Payer: Self-pay | Admitting: Gastroenterology

## 2024-08-11 ENCOUNTER — Ambulatory Visit: Admitting: Gastroenterology

## 2024-08-11 ENCOUNTER — Other Ambulatory Visit

## 2024-08-11 ENCOUNTER — Encounter: Payer: Self-pay | Admitting: Gastroenterology

## 2024-08-11 VITALS — BP 126/82 | HR 100 | Ht 73.0 in | Wt 305.0 lb

## 2024-08-11 DIAGNOSIS — R7401 Elevation of levels of liver transaminase levels: Secondary | ICD-10-CM

## 2024-08-11 DIAGNOSIS — K580 Irritable bowel syndrome with diarrhea: Secondary | ICD-10-CM

## 2024-08-11 DIAGNOSIS — K760 Fatty (change of) liver, not elsewhere classified: Secondary | ICD-10-CM | POA: Diagnosis not present

## 2024-08-11 DIAGNOSIS — Z6841 Body Mass Index (BMI) 40.0 and over, adult: Secondary | ICD-10-CM | POA: Diagnosis not present

## 2024-08-11 DIAGNOSIS — R7989 Other specified abnormal findings of blood chemistry: Secondary | ICD-10-CM

## 2024-08-11 LAB — CBC WITH DIFFERENTIAL/PLATELET
Basophils Absolute: 0.1 K/uL (ref 0.0–0.1)
Basophils Relative: 1.2 % (ref 0.0–3.0)
Eosinophils Absolute: 0.3 K/uL (ref 0.0–0.7)
Eosinophils Relative: 3.6 % (ref 0.0–5.0)
HCT: 51.2 % (ref 39.0–52.0)
Hemoglobin: 17.9 g/dL — ABNORMAL HIGH (ref 13.0–17.0)
Lymphocytes Relative: 26.1 % (ref 12.0–46.0)
Lymphs Abs: 2.2 K/uL (ref 0.7–4.0)
MCHC: 34.8 g/dL (ref 30.0–36.0)
MCV: 89.6 fl (ref 78.0–100.0)
Monocytes Absolute: 0.6 K/uL (ref 0.1–1.0)
Monocytes Relative: 7.1 % (ref 3.0–12.0)
Neutro Abs: 5.2 K/uL (ref 1.4–7.7)
Neutrophils Relative %: 62 % (ref 43.0–77.0)
Platelets: 244 K/uL (ref 150.0–400.0)
RBC: 5.72 Mil/uL (ref 4.22–5.81)
RDW: 13.3 % (ref 11.5–15.5)
WBC: 8.4 K/uL (ref 4.0–10.5)

## 2024-08-11 LAB — LIPID PANEL
Cholesterol: 218 mg/dL — ABNORMAL HIGH (ref 28–200)
HDL: 36.8 mg/dL — ABNORMAL LOW
LDL Cholesterol: 136 mg/dL — ABNORMAL HIGH (ref 10–99)
NonHDL: 181.38
Total CHOL/HDL Ratio: 6
Triglycerides: 228 mg/dL — ABNORMAL HIGH (ref 10.0–149.0)
VLDL: 45.6 mg/dL — ABNORMAL HIGH (ref 0.0–40.0)

## 2024-08-11 LAB — COMPREHENSIVE METABOLIC PANEL WITH GFR
ALT: 78 U/L — ABNORMAL HIGH (ref 3–53)
AST: 38 U/L — ABNORMAL HIGH (ref 5–37)
Albumin: 4.8 g/dL (ref 3.5–5.2)
Alkaline Phosphatase: 74 U/L (ref 39–117)
BUN: 14 mg/dL (ref 6–23)
CO2: 30 meq/L (ref 19–32)
Calcium: 9.6 mg/dL (ref 8.4–10.5)
Chloride: 103 meq/L (ref 96–112)
Creatinine, Ser: 1.06 mg/dL (ref 0.40–1.50)
GFR: 83.67 mL/min
Glucose, Bld: 101 mg/dL — ABNORMAL HIGH (ref 70–99)
Potassium: 4.3 meq/L (ref 3.5–5.1)
Sodium: 140 meq/L (ref 135–145)
Total Bilirubin: 0.9 mg/dL (ref 0.2–1.2)
Total Protein: 7.6 g/dL (ref 6.0–8.3)

## 2024-08-11 LAB — HEPATIC FUNCTION PANEL
ALT: 78 U/L — ABNORMAL HIGH (ref 3–53)
AST: 38 U/L — ABNORMAL HIGH (ref 5–37)
Albumin: 4.8 g/dL (ref 3.5–5.2)
Alkaline Phosphatase: 74 U/L (ref 39–117)
Bilirubin, Direct: 0.2 mg/dL (ref 0.1–0.3)
Total Bilirubin: 0.9 mg/dL (ref 0.2–1.2)
Total Protein: 7.6 g/dL (ref 6.0–8.3)

## 2024-08-11 LAB — HEMOGLOBIN A1C: Hgb A1c MFr Bld: 5.2 % (ref 4.6–6.5)

## 2024-08-11 MED ORDER — SEMAGLUTIDE-WEIGHT MANAGEMENT 0.25 MG/0.5ML ~~LOC~~ SOAJ: 0.2500 mg | SUBCUTANEOUS | 0 refills | Status: DC

## 2024-08-11 NOTE — Telephone Encounter (Signed)
 PA request has been Cancelled. New Encounter has been or will be created for follow up. For additional info see Pharmacy Prior Auth telephone encounter from 08/11/2024.

## 2024-08-11 NOTE — Progress Notes (Signed)
 "  HPI :  49 year old male here for follow-up visit for IBS-D and MASLD.  Last seen June 2024.  Recall he carries a diagnosis of IBS-D.  Negative colonoscopy in 2017 without evidence for microscopic colitis.  Negative celiac disease evaluation.  Previously on Colestid  which worked well however caused triglyceride levels to go too high and it was stopped.  Viberzi  was too strong for him.  He has been on Elavil  for years now and this has worked really well for him.  He continues to take 20 mg nightly.  His bowels have been pretty regular for the most part, occasionally can have a loose stool.  He states the Elavil  has worked really well and he is happy with the regimen.  He has been given Bentyl  in the past but states he has not really ever needed to use it.  He is due for screening colonoscopy in 2027.   Recall otherwise he has a history of fatty liver disease (MASLD) with negative serologic workup.  He denies any significant or routine alcohol use at baseline.  He has had a mild to moderate transaminitis that has fluctuated over years.  He had been in the 50s to 60s range for ALT in recent years however rose to low 100s in November.  He previously had an ALT in the mid 100s back in 2020.  His body mass index has been elevated for some time, over 40.  At the last visit he was 299 pounds, currently he is 305 pounds with a body mass index of over 40.  He again denies any routine alcohol use.  He does not drink much coffee although has tried it in the past and tolerates it.  He does not exercise much.  He travels quite a bit for work.  He has not been able to lose weight, has tried cutting back on carbs.  I have referred him to [facility] health weight loss center in the past but he was not able to keep that appointment.  Recall he had elastography in November 2022 showing a K PA 5.6 (low risk for advanced fibrosis).  He previously was not immune to hepatitis A in the past but has received the vaccine for that.  We  discussed his options as outlined     Colonoscopy - 04/24/16 - normal ileum, normal colon other than 2 small polyps - hyperplastic, small hemorhoids, biopsies taken - no microscopic colitis    US  abdomen 05/29/17 - IMPRESSION: 1. Two very small gallbladder polyps which are unchanged since July, 2017. No further imaging follow-up is felt necessary. 2. Diffuse hepatic steatosis with focal sparing adjacent to the gallbladder. No significant focal hepatic parenchymal abnormalities.     US  elastography 06/16/21: MPRESSION: ULTRASOUND RUQ:   1. Hepatic steatosis with focal fatty sparing along the gallbladder fossa.   2. Gallbladder polyps measuring up to 5 mm. No further evaluation or follow up necessary based on size criteria. Per consensus guidelines, this requires no additional evaluation or specific follow-up. This recommendation follows ACR consensus guidelines: White Paper of the ACR Incidental findings Committee II on Gallbladder and Biliary Findings. J Am Coll Radiol 2013:;10:953-956.   ULTRASOUND HEPATIC ELASTOGRAPHY:   Median kPa:  5.6   Diagnostic category: < or = 9 kPa: in the absence of other known clinical signs, rules out cACLD   Lab Results  Component Value Date   ALT 78 (H) 08/11/2024   ALT 78 (H) 08/11/2024   AST 38 (H) 08/11/2024  AST 38 (H) 08/11/2024   ALKPHOS 74 08/11/2024   ALKPHOS 74 08/11/2024   BILITOT 0.9 08/11/2024   BILITOT 0.9 08/11/2024   Weight: 299 lbs 01/10/23 305 lbs today   Past Medical History:  Diagnosis Date   Anxiety    alpraz started about 2011   Fatty liver    u/s 04/2017.  Very mild ALT elevations.   IBS (irritable bowel syndrome)    -D.  Cholestyramine helpful.   Insomnia    Metabolic dysfunction-associated steatotic liver disease (MASLD)    Morbid obesity (HCC)    Obesity, Class I, BMI 30-34.9    Seasonal allergic rhinitis      Past Surgical History:  Procedure Laterality Date   COLONOSCOPY W/ POLYPECTOMY   04/24/2016   polyps x 2= hyperplastic.  Random colon bx's NEG.  Recall 11 yrs (2028, age 10).   Family History  Problem Relation Age of Onset   Arthritis Father    Prostate cancer Father    Colon cancer Neg Hx    Stomach cancer Neg Hx    Pancreatic cancer Neg Hx    Esophageal cancer Neg Hx    Social History[1] Current Outpatient Medications  Medication Sig Dispense Refill   ALPRAZolam  (XANAX ) 1 MG tablet Take 1 tablet by mouth twice daily as needed for anxiety 60 tablet 0   amitriptyline  (ELAVIL ) 10 MG tablet TAKE 2 TABLETS BY MOUTH AT BEDTIME. KEEP DECEMBER APPOINTMENT FOR ANY FURTHER REFILLS 60 tablet 2   dicyclomine  (BENTYL ) 10 MG capsule Take 1-2 capsules (10-20 mg total) by mouth every 8 (eight) hours as needed for spasms. 60 capsule 1   semaglutide -weight management (WEGOVY ) 0.25 MG/0.5ML SOAJ SQ injection Inject 0.25 mg into the skin once a week. for one month then increase to 0.5 mg weekly for one month, then increase to 1.0 mg weekly for 1 month, then increase to 1.7 mg weekly for 1 month then increase to 2.4 mg weekly thereafter 2 mL 0   predniSONE  (DELTASONE ) 10 MG tablet 4 tabs every day x 3d then 3 tabs every day x 3d then 2 tabs every day x 3d then 1 tab every day x 3d (Patient not taking: Reported on 04/16/2024) 30 tablet 0   No current facility-administered medications for this visit.   Allergies[2]   Review of Systems: All systems reviewed and negative except where noted in HPI.    Physical Exam: BP 126/82   Pulse 100   Ht 6' 1 (1.854 m)   Wt (!) 305 lb (138.3 kg)   SpO2 96%   BMI 40.24 kg/m  Constitutional: Pleasant,well-developed, male in no acute distress. Neurological: Alert and oriented to person place and time. Psychiatric: Normal mood and affect. Behavior is normal.   ASSESSMENT: 49 y.o. male here for assessment of the following  1. Metabolic dysfunction-associated steatotic liver disease (MASLD)   2. Morbid obesity (HCC)   3. Transaminitis    4. Irritable bowel syndrome with diarrhea    History of MASLD and morbid obesity.  Fluctuating transaminitis over years, most recently back in to the 100s.  I referred him to a weight loss clinic previously, he was not able to keep that appointment.  He has gained weight since her last visit, not able to exercise much, trying to work on diet.  He has had this now for several years, I am worried about his long-term risk for fibrosis specially with increased weight gain and we discussed this at length.  He really needs to  work on his diet, high-protein calorie restricted diet for weight loss.  I strongly recommend a weight loss center to help him with this, he is agreeable with this and we will refer him back to the [facility] health weight loss center again.  He is due for basic labs, I would like to repeat CBC to check platelet count, calculate fib 4, recheck his ALT given interval rise, will also check A1c and check his lipids since he is fasting.  I encouraged routine exercise, encouraged routine coffee intake, and minimize alcohol use.  I think it is reasonable to reassess him for fibrotic change of his liver, recommend FibroScan will put him on the wait list to get that done in the next few months.  For his morbid obesity and difficulty losing weight, as well as fatty liver, I think he is a good candidate for GLP-1 agonist, semaglutide .  I discussed risks of the drug with him to include GI upset, pancreatitis, gallbladder disease, etc.  We reviewed risks and he understands this and wants to proceed if insurance will cover it.  Will see if they will cover this for his morbid obesity.  Start at 0.25 mg once weekly and then increase over time as recommended below.  Will work on prior authorization for this.  Otherwise continue Elavil , working well for IBS.  I would like to see him back in a few months for reassessment.  PLAN: - work on diet / weight loss - referral to [facility] -  lab today - CBC, CMET, A1c, lipids - encouraged coffee intake, minimize EtOH - add to wait list for Fibroscan - prescribe semaglutide   Week 1 through week 4 : 0.25 mg once weekly. Week 5 through week 8: 0.5 mg once weekly. Week 9 through week 12: 1 mg once weekly. Week 13 through week 16: 1.7 mg once weekly. Week 17 and thereafter (maintenance dosage): 2.4 mg once weekly (preferred regimen); if not tolerated, may use an alternative maintenance dosage of 1.7 mg once weekly.  - refill Elavil  20mg  q HS  - follow up 3-4 months  Marcey Naval, MD [facility]    [1]  Social History Tobacco Use   Smoking status: Never   Smokeless tobacco: Never  Vaping Use   Vaping status: Never Used  Substance Use Topics   Alcohol use: Yes    Alcohol/week: 0.0 standard drinks of alcohol    Comment: O[facility]   Drug use: No  [2]  Allergies Allergen Reactions   Colestid  [Colestipol ] Other (See Comments)    Hypertriglyceridemia   "

## 2024-08-11 NOTE — Telephone Encounter (Signed)
 Please initiate PA for semaglutide  (morbid obesity). Thank you

## 2024-08-11 NOTE — Telephone Encounter (Signed)
 Pharmacy Patient Advocate Encounter   Received notification from Pt Calls Messages that prior authorization for Wegovy  0.25MG /0.5ML auto-injectors is required/requested.   Insurance verification completed.   The patient is insured through [facility].   Prior Authorization for Wegovy  0.25MG /0.5ML auto-injectors has been CANCELLED due to Medication is not covered under pharmacy benefits.    PA #/Case ID/Reference #: [id number]

## 2024-08-11 NOTE — Patient Instructions (Addendum)
 Please go to the lab in the basement of our building to have fasting lab work. Hit B for basement when you get on the elevator.  When the doors open the lab is on your left.  We will call you with the results. Thank you.  We are referring you to [facility].  They will contact you directly to schedule an appointment.  It may take a week or more before you hear from them.  Please feel free to contact us  if you have not heard from them within 2 weeks and we will follow up on the referral.    We have sent the following medications to your pharmacy for you to pick up at your convenience: Semaglutide  0.25 mg/0.63mL SQ - inject under the skin once weekly for 30 days then increase monthly (0.5mg , 1.0 mg, 1.7 mg and finally 2.4 mg thereafter)  Note: we will submit request and see if they will approve it. If approved we will send the rest of the initiation doses    We will add you to the list for Fibroscan.  Thank you for entrusting me with your care and for choosing [facility], Dr. Elspeth Naval   _______________________________________________________  If your blood pressure at your visit was 140/90 or greater, please contact your primary care physician to follow up on this.  _______________________________________________________  If you are age 30 or older, your body mass index should be between 23-30. Your Body mass index is 40.24 kg/m. If this is out of the aforementioned range listed, please consider follow up with your Primary Care Provider.  If you are age 89 or younger, your body mass index should be between 19-25. Your Body mass index is 40.24 kg/m. If this is out of the aformentioned range listed, please consider follow up with your Primary Care Provider.   ________________________________________________________  The [facility] providers would like to encourage you to use MYCHART to communicate with providers for non-urgent requests or questions.   Due to long hold times on the telephone, sending your provider a message by [facility] may be a faster and more efficient way to get a response.  Please allow 48 business hours for a response.  Please remember that this is for non-urgent requests.  _______________________________________________________  [facility] is using a team-based approach to care.  Your team is made up of your doctor and two to three APPS. Our APPS (Nurse Practitioners and Physician Assistants) work with your physician to ensure care continuity for you. They are fully qualified to address your health concerns and develop a treatment plan. They communicate directly with your gastroenterologist to care for you. Seeing the Advanced Practice Practitioners on your physician's team can help you by facilitating care more promptly, often allowing for earlier appointments, access to diagnostic testing, procedures, and other specialty referrals.

## 2024-08-12 ENCOUNTER — Other Ambulatory Visit (HOSPITAL_COMMUNITY): Payer: Self-pay

## 2024-08-12 NOTE — Telephone Encounter (Signed)
 None are covered through his pharmacy benefits.

## 2024-08-12 NOTE — Telephone Encounter (Signed)
 Got it, thank you for clarifying.  Jan-can you let the patient know none of the GLP-1 agonists are currently covered by his insurance.  We will await his referral to the weight loss center and pending FibroScan in the upcoming months.  Thanks

## 2024-08-12 NOTE — Telephone Encounter (Signed)
My Chart message to pt

## 2024-08-12 NOTE — Telephone Encounter (Signed)
 Sorry to hear this.  Rosina can you clarify if this patient's insurance covers any of the GLP1 agonists for morbid obesity / weight loss? Thanks

## 2024-08-16 ENCOUNTER — Other Ambulatory Visit: Payer: Self-pay | Admitting: Family Medicine

## 2024-08-17 NOTE — Telephone Encounter (Signed)
#  15 rx'd today. Overdue for f/u.

## 2024-08-28 ENCOUNTER — Ambulatory Visit: Payer: Self-pay

## 2024-08-28 ENCOUNTER — Encounter: Payer: Self-pay | Admitting: Family Medicine

## 2024-08-28 ENCOUNTER — Ambulatory Visit (INDEPENDENT_AMBULATORY_CARE_PROVIDER_SITE_OTHER): Admitting: Family Medicine

## 2024-08-28 VITALS — BP 125/85 | HR 90 | Temp 98.0°F | Ht 73.0 in | Wt 302.4 lb

## 2024-08-28 DIAGNOSIS — R051 Acute cough: Secondary | ICD-10-CM

## 2024-08-28 DIAGNOSIS — J101 Influenza due to other identified influenza virus with other respiratory manifestations: Secondary | ICD-10-CM

## 2024-08-28 DIAGNOSIS — J180 Bronchopneumonia, unspecified organism: Secondary | ICD-10-CM

## 2024-08-28 LAB — POC COVID19/FLU A&B COMBO
Covid Antigen, POC: NEGATIVE
Influenza A Antigen, POC: NEGATIVE
Influenza B Antigen, POC: POSITIVE — AB

## 2024-08-28 MED ORDER — OSELTAMIVIR PHOSPHATE 75 MG PO CAPS: 75.0000 mg | ORAL_CAPSULE | Freq: Two times a day (BID) | ORAL | 0 refills | Status: AC

## 2024-08-28 MED ORDER — BENZONATATE 200 MG PO CAPS: 200.0000 mg | ORAL_CAPSULE | Freq: Three times a day (TID) | ORAL | 0 refills | Status: AC | PRN

## 2024-08-28 MED ORDER — DOXYCYCLINE HYCLATE 100 MG PO CAPS: 100.0000 mg | ORAL_CAPSULE | Freq: Two times a day (BID) | ORAL | 0 refills | Status: AC

## 2024-08-28 NOTE — Progress Notes (Signed)
 OFFICE VISIT  08/28/2024  CC:  Chief Complaint  Patient presents with   Cough    Headache since December; flu exposure this past weekend; ran temp on Sunday    Patient is a 49 y.o. male who presents for cough  HPI: 6 weeks ago he got an acute febrile respiratory illness.  The fever resolved in 1 to 2 days and the cough got better but has persisted.  Dry cough.  No wheezing, chest pain, chest tightness or shortness of breath.  Sometimes his coughing fits are so forceful that he gets headache.  No sore throat.  No rash.  No nausea, vomiting, or diarrhea. He then got another fever with bodyaches and headache about 5 days ago.  Last night got a fever up to 104. His son was diagnosed with strep and flu yesterday.  He does not smoke.  Past Medical History:  Diagnosis Date   Anxiety    alpraz started about 2011   Fatty liver    u/s 04/2017.  Very mild ALT elevations.   IBS (irritable bowel syndrome)    -D.  Cholestyramine helpful.   Insomnia    Metabolic dysfunction-associated steatotic liver disease (MASLD)    Morbid obesity (HCC)    Obesity, Class I, BMI 30-34.9    Seasonal allergic rhinitis     Past Surgical History:  Procedure Laterality Date   COLONOSCOPY W/ POLYPECTOMY  04/24/2016   polyps x 2= hyperplastic.  Random colon bx's NEG.  Recall 11 yrs (2028, age 34).    Outpatient Medications Prior to Visit  Medication Sig Dispense Refill   ALPRAZolam  (XANAX ) 1 MG tablet Take 1 tablet by mouth twice daily as needed for anxiety 15 tablet 0   amitriptyline  (ELAVIL ) 10 MG tablet TAKE 2 TABLETS BY MOUTH AT BEDTIME. KEEP DECEMBER APPOINTMENT FOR ANY FURTHER REFILLS 60 tablet 2   dicyclomine  (BENTYL ) 10 MG capsule Take 1-2 capsules (10-20 mg total) by mouth every 8 (eight) hours as needed for spasms. 60 capsule 1   predniSONE  (DELTASONE ) 10 MG tablet 4 tabs every day x 3d then 3 tabs every day x 3d then 2 tabs every day x 3d then 1 tab every day x 3d (Patient not taking: Reported on  04/16/2024) 30 tablet 0   semaglutide -weight management (WEGOVY ) 0.25 MG/0.5ML SOAJ SQ injection Inject 0.25 mg into the skin once a week. for one month then increase to 0.5 mg weekly for one month, then increase to 1.0 mg weekly for 1 month, then increase to 1.7 mg weekly for 1 month then increase to 2.4 mg weekly thereafter (Patient not taking: Reported on 08/28/2024) 2 mL 0   No facility-administered medications prior to visit.    Allergies[1]  Review of Systems  As per HPI  PE:    08/28/2024   11:23 AM 08/11/2024    8:37 AM 08/31/2023    2:13 PM  Vitals with BMI  Height 6' 1 6' 1 6' 1  Weight 302 lbs 6 oz 305 lbs 290 lbs  BMI 39.91 40.25 38.27  Systolic 125 126   Diastolic 85 82   Pulse 90 100      Physical Exam  Gen: Alert, well appearing.  Patient is oriented to person, place, time, and situation. AFFECT: pleasant, lucid thought and speech. ZWU:Zbzd: no injection, icteris, swelling, or exudate.  EOMI, PERRLA. Mouth: lips without lesion/swelling.  Oral mucosa pink and moist. Oropharynx without erythema, exudate, or swelling.  CV: RRR, no m/r/g.   LUNGS: CTA bilat,  nonlabored resps, good aeration in all lung fields.  LABS:  Last metabolic panel Lab Results  Component Value Date   GLUCOSE 101 (H) 08/11/2024   NA 140 08/11/2024   K 4.3 08/11/2024   CL 103 08/11/2024   CO2 30 08/11/2024   BUN 14 08/11/2024   CREATININE 1.06 08/11/2024   GFR 83.67 08/11/2024   CALCIUM 9.6 08/11/2024   PROT 7.6 08/11/2024   PROT 7.6 08/11/2024   ALBUMIN 4.8 08/11/2024   ALBUMIN 4.8 08/11/2024   BILITOT 0.9 08/11/2024   BILITOT 0.9 08/11/2024   ALKPHOS 74 08/11/2024   ALKPHOS 74 08/11/2024   AST 38 (H) 08/11/2024   AST 38 (H) 08/11/2024   ALT 78 (H) 08/11/2024   ALT 78 (H) 08/11/2024   ANIONGAP 9 08/31/2023   IMPRESSION AND PLAN:  Prolonged respiratory illness/cough, now with superimposed influenza B. Rapid flu and covid testing today --> positive flu B.  Tamiflu  75  twice daily x 5 days. Will treat for bacterial pneumonia: Doxycycline  100 mg twice daily x 10 days. Tessalon  Perles 200 mg 3 times daily as needed, #30, no refill.  An After Visit Summary was printed and given to the patient.  FOLLOW UP: Return if symptoms worsen or fail to improve.  Signed:  Gerlene Hockey, MD           08/28/2024     [1]  Allergies Allergen Reactions   Colestid  [Colestipol ] Other (See Comments)    Hypertriglyceridemia

## 2024-08-28 NOTE — Telephone Encounter (Signed)
 FYI Only or Action Required?: FYI only for provider: appointment scheduled on 1/30.  Patient was last seen in primary care on 08/07/2023 by McGowen, Aleene DEL, MD.  Called Nurse Triage reporting No chief complaint on file..  Symptoms began today.  Interventions attempted: OTC medications: tylenol .  Symptoms are: gradually worsening.  Triage Disposition: No disposition on file.  Patient/caregiver understands and will follow disposition?:   Reason for Triage: fever 101 to 104, head aches, cold temp, aches over body 6 pain level. Cough since December   Reason for Disposition  Cough present > 3 weeks  Answer Assessment - Initial Assessment Questions 1. WORST SYMPTOM: What is your worst symptom? (e.g., cough, runny nose, muscle aches, headache, sore throat, fever)      Body aches, ha 2. ONSET: When did your flu symptoms start?      Last night 3. COUGH: How bad is the cough?       moderate 4. RESPIRATORY DISTRESS: Describe your breathing.      denies 5. FEVER: Do you have a fever? If Yes, ask: What is your temperature, how was it measured, and when did it start?     100.9 this am 6. EXPOSURE: Were you exposed to someone with influenza?       Yes family   10. OTHER SYMPTOMS: Do you have any other symptoms?  (e.g., runny nose, muscle aches, headache, sore throat)       Cough since December.  Protocols used: Influenza (Flu) - [facility]
